# Patient Record
Sex: Male | Born: 1985 | Race: Black or African American | Hispanic: No | Marital: Single | Smoking: Current every day smoker
Health system: Southern US, Community
[De-identification: ages and names within clinical notes are randomized; demographics above are authoritative.]

## PROBLEM LIST (undated history)

## (undated) DIAGNOSIS — Z21 Asymptomatic human immunodeficiency virus [HIV] infection status: Secondary | ICD-10-CM

## (undated) DIAGNOSIS — A812 Progressive multifocal leukoencephalopathy: Secondary | ICD-10-CM

## (undated) DIAGNOSIS — B20 Human immunodeficiency virus [HIV] disease: Secondary | ICD-10-CM

---

## 2006-03-07 ENCOUNTER — Emergency Department (HOSPITAL_COMMUNITY): Admission: EM | Admit: 2006-03-07 | Discharge: 2006-03-07 | Payer: Self-pay | Admitting: Family Medicine

## 2007-07-15 IMAGING — CR DG SHOULDER 2+V*R*
4 series · 4 of 4 positions shown · non-contrast
Comparison: none

CLINICAL DATA: Trauma to shoulder with pain in the anterior humerus.  Evaluate for dislocation.
 RIGHT SHOULDER ? 3 VIEWS:

[view not recorded (1 of 4)]
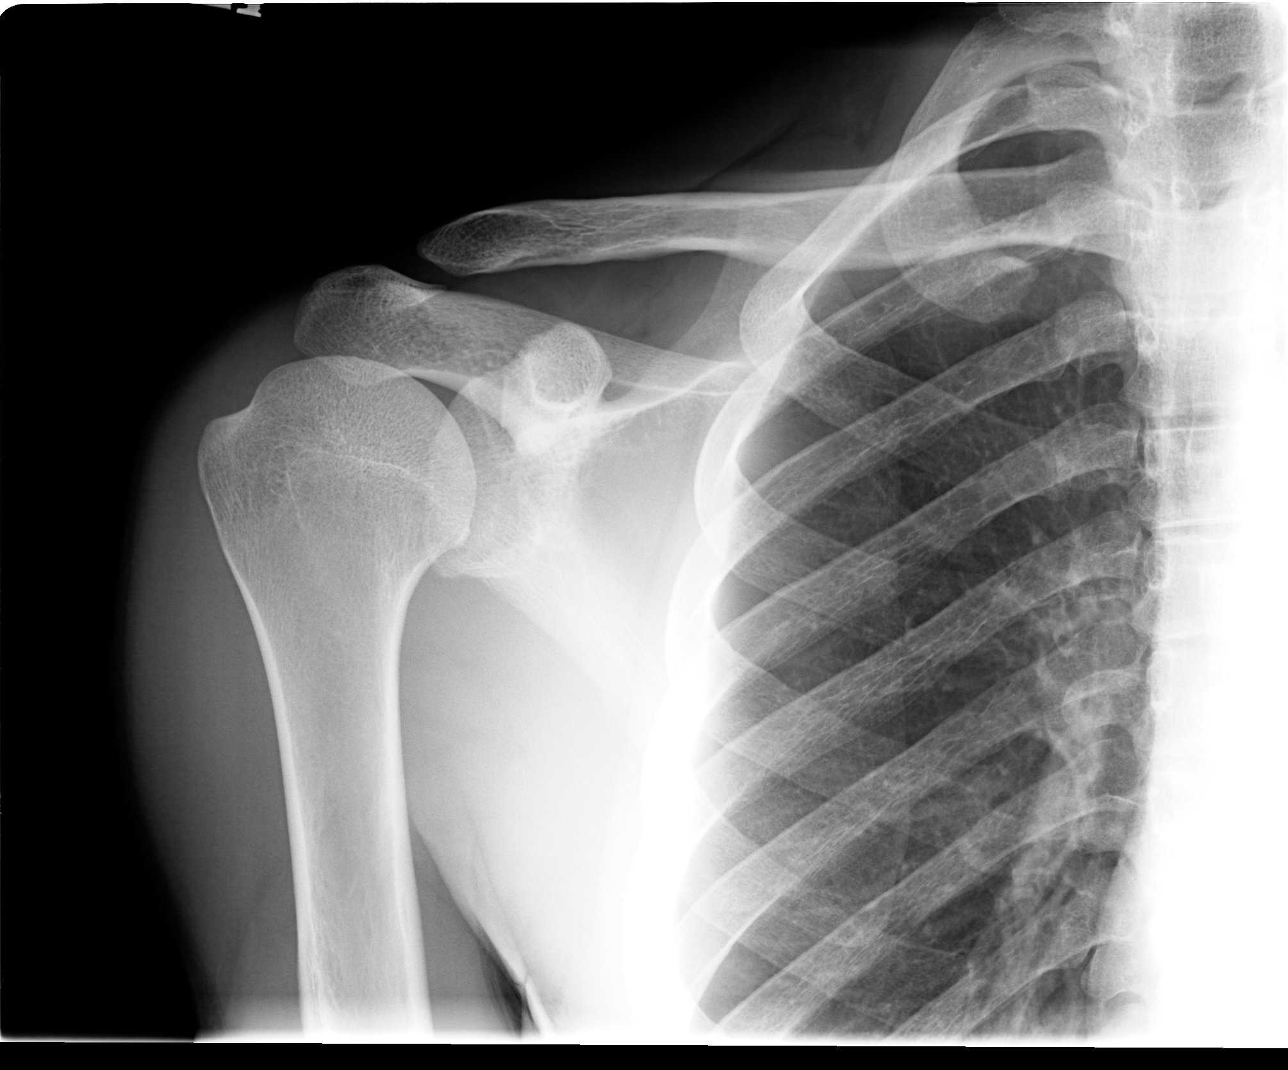

[view not recorded (2 of 4)]
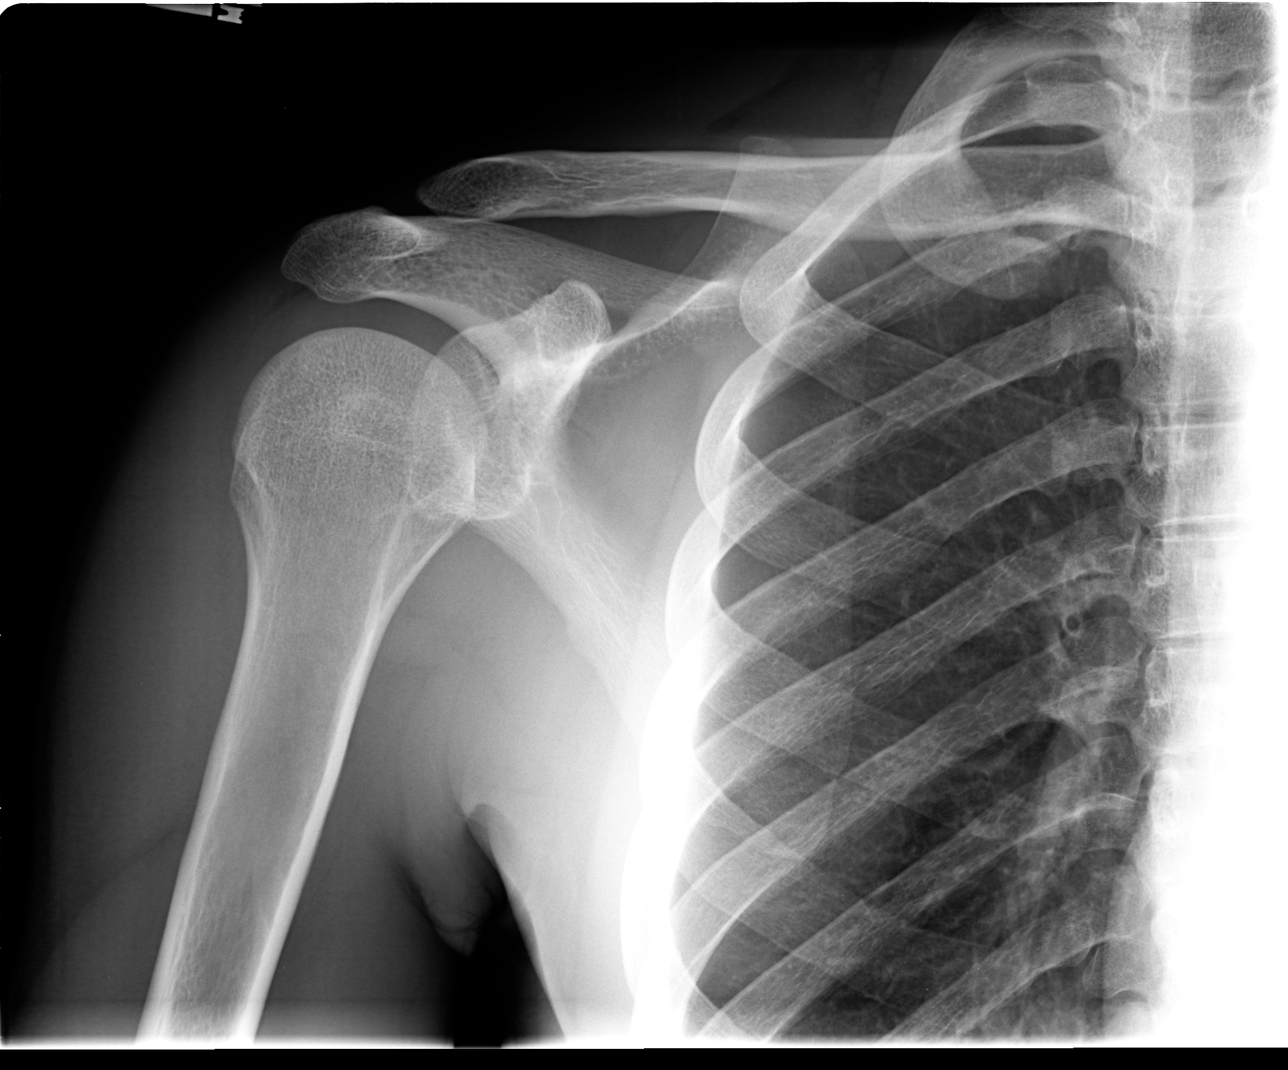

[view not recorded (3 of 4)]
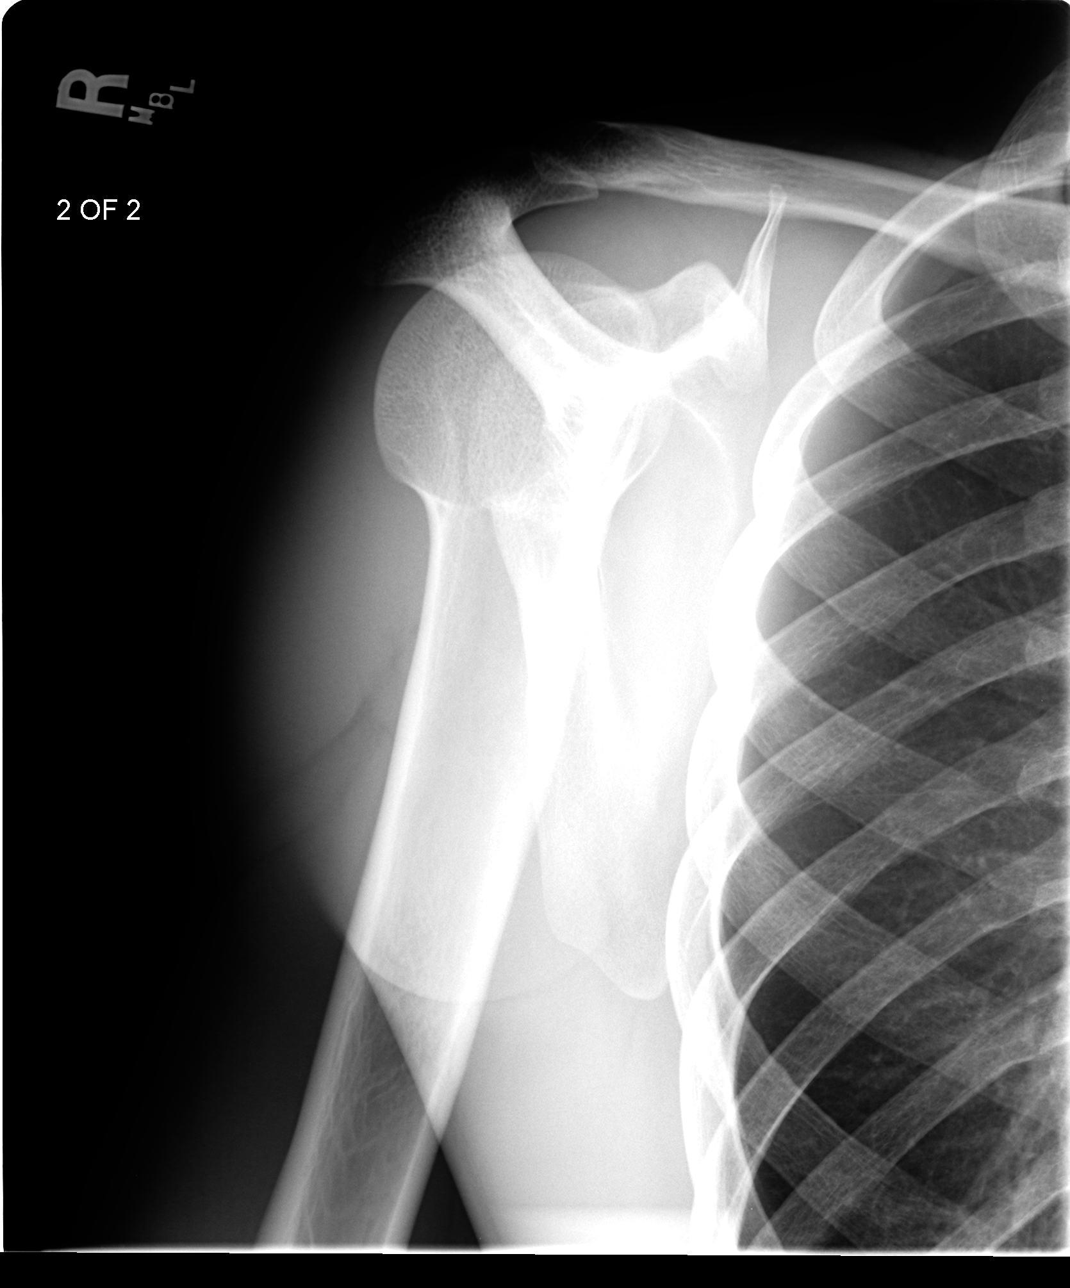

[view not recorded (4 of 4)]
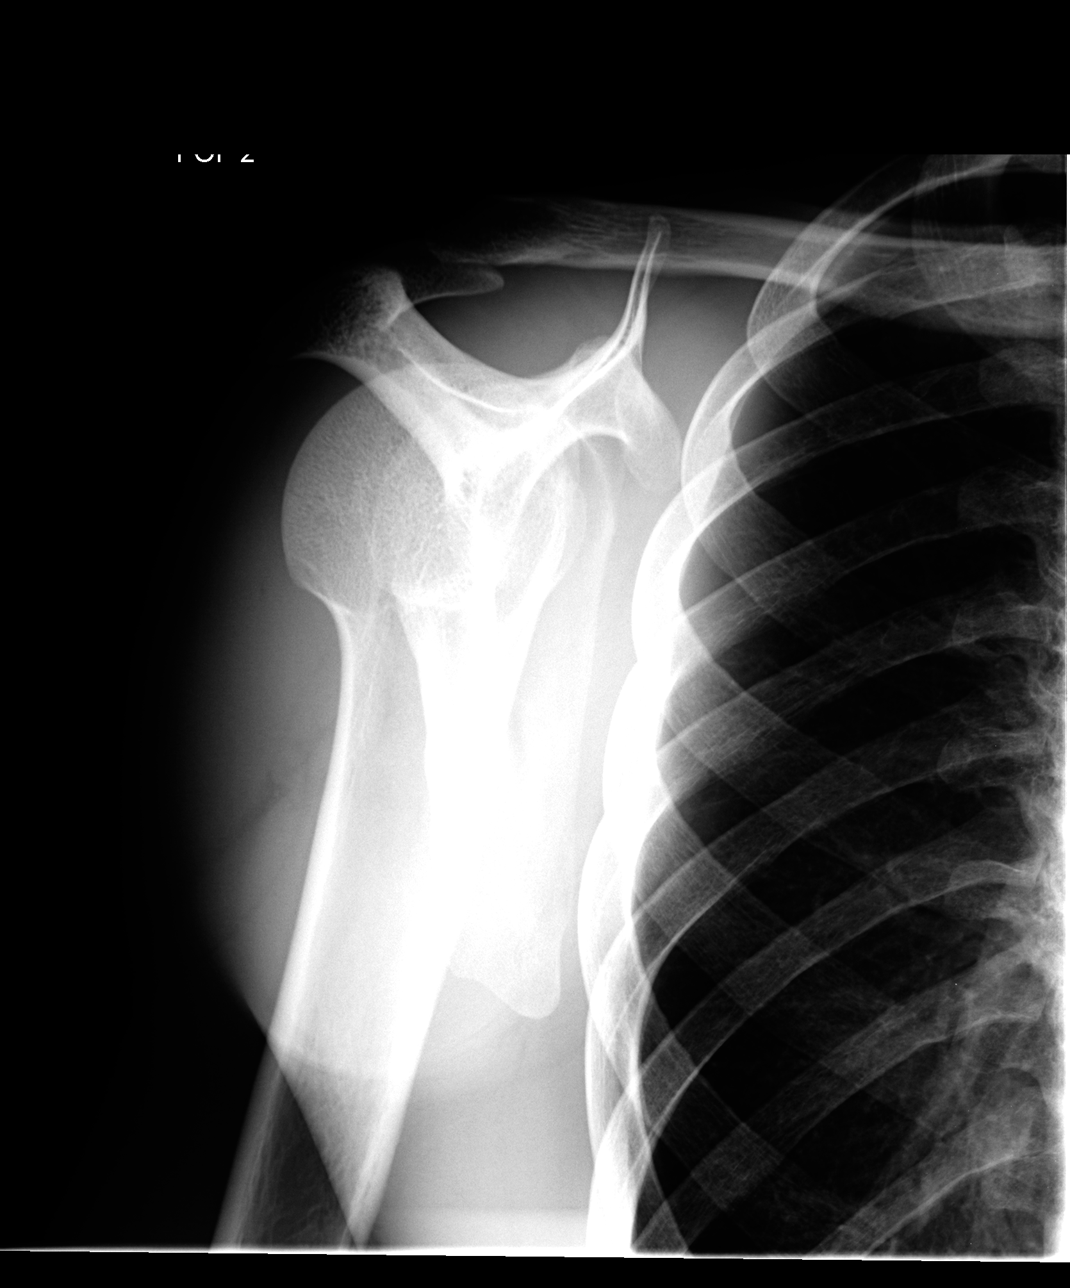

[4 of 4 positions shown; findings below may reference images not displayed]

FINDINGS: No dislocation is identified.  There are no fractures.
IMPRESSION: No acute findings.

## 2019-05-09 ENCOUNTER — Other Ambulatory Visit: Payer: Self-pay

## 2019-05-09 ENCOUNTER — Emergency Department (HOSPITAL_COMMUNITY)
Admission: EM | Admit: 2019-05-09 | Discharge: 2019-05-09 | Disposition: A | Discharge status: Home / Self Care | Payer: Medicare Other | Attending: Emergency Medicine | Admitting: Emergency Medicine

## 2019-05-09 ENCOUNTER — Encounter (HOSPITAL_COMMUNITY): Payer: Self-pay

## 2019-05-09 DIAGNOSIS — F1510 Other stimulant abuse, uncomplicated: Secondary | ICD-10-CM | POA: Diagnosis not present

## 2019-05-09 DIAGNOSIS — Z79899 Other long term (current) drug therapy: Secondary | ICD-10-CM | POA: Insufficient documentation

## 2019-05-09 DIAGNOSIS — R110 Nausea: Secondary | ICD-10-CM

## 2019-05-09 DIAGNOSIS — F17200 Nicotine dependence, unspecified, uncomplicated: Secondary | ICD-10-CM | POA: Insufficient documentation

## 2019-05-09 DIAGNOSIS — B20 Human immunodeficiency virus [HIV] disease: Secondary | ICD-10-CM | POA: Diagnosis not present

## 2019-05-09 HISTORY — DX: Asymptomatic human immunodeficiency virus (hiv) infection status: Z21

## 2019-05-09 HISTORY — DX: Human immunodeficiency virus (HIV) disease: B20

## 2019-05-09 HISTORY — DX: Progressive multifocal leukoencephalopathy: A81.2

## 2019-05-09 LAB — CBC WITH DIFFERENTIAL/PLATELET
Abs Immature Granulocytes: 0.02 10*3/uL (ref 0.00–0.07)
Basophils Absolute: 0 10*3/uL (ref 0.0–0.1)
Basophils Relative: 0 %
Eosinophils Absolute: 0 10*3/uL (ref 0.0–0.5)
Eosinophils Relative: 0 %
HCT: 45.6 % (ref 39.0–52.0)
Hemoglobin: 14.9 g/dL (ref 13.0–17.0)
Immature Granulocytes: 0 %
Lymphocytes Relative: 18 %
Lymphs Abs: 1.9 10*3/uL (ref 0.7–4.0)
MCH: 30 pg (ref 26.0–34.0)
MCHC: 32.7 g/dL (ref 30.0–36.0)
MCV: 91.8 fL (ref 80.0–100.0)
Monocytes Absolute: 0.7 10*3/uL (ref 0.1–1.0)
Monocytes Relative: 6 %
Neutro Abs: 8 10*3/uL — ABNORMAL HIGH (ref 1.7–7.7)
Neutrophils Relative %: 76 %
Platelets: 217 10*3/uL (ref 150–400)
RBC: 4.97 MIL/uL (ref 4.22–5.81)
RDW: 13.5 % (ref 11.5–15.5)
WBC: 10.6 10*3/uL — ABNORMAL HIGH (ref 4.0–10.5)
nRBC: 0 % (ref 0.0–0.2)

## 2019-05-09 LAB — COMPREHENSIVE METABOLIC PANEL
ALT: 24 U/L (ref 0–44)
AST: 30 U/L (ref 15–41)
Albumin: 5.4 g/dL — ABNORMAL HIGH (ref 3.5–5.0)
Alkaline Phosphatase: 55 U/L (ref 38–126)
Anion gap: 13 (ref 5–15)
BUN: 24 mg/dL — ABNORMAL HIGH (ref 6–20)
CO2: 24 mmol/L (ref 22–32)
Calcium: 9.4 mg/dL (ref 8.9–10.3)
Chloride: 103 mmol/L (ref 98–111)
Creatinine, Ser: 1.17 mg/dL (ref 0.61–1.24)
GFR calc Af Amer: >60 mL/min (ref >60)
GFR calc non Af Amer: >60 mL/min (ref >60)
Glucose, Bld: 163 mg/dL — ABNORMAL HIGH (ref 70–99)
Potassium: 3.6 mmol/L (ref 3.5–5.1)
Sodium: 140 mmol/L (ref 135–145)
Total Bilirubin: 0.8 mg/dL (ref 0.3–1.2)
Total Protein: 8.8 g/dL — ABNORMAL HIGH (ref 6.5–8.1)

## 2019-05-09 MED ORDER — ONDANSETRON 4 MG PO TBDP: ORAL_TABLET | ORAL | 0 refills | Status: AC

## 2019-05-09 MED ORDER — ONDANSETRON HCL 4 MG/2ML IJ SOLN
4.0000 mg | Freq: Once | INTRAMUSCULAR | Status: AC
  Administered 2019-05-09: 21:00:00 4 mg via INTRAVENOUS
  Filled 2019-05-09: qty 2

## 2019-05-09 MED ORDER — SODIUM CHLORIDE 0.9 % IV BOLUS
1000.0000 mL | Freq: Once | INTRAVENOUS | Status: AC
  Administered 2019-05-09: 21:00:00 1000 mL via INTRAVENOUS

## 2019-05-09 MED ORDER — LORAZEPAM 2 MG/ML IJ SOLN
0.5000 mg | Freq: Once | INTRAMUSCULAR | Status: AC
  Administered 2019-05-09: 0.5 mg via INTRAVENOUS
  Filled 2019-05-09: qty 1

## 2019-05-09 NOTE — Discharge Instructions (Signed)
Follow up if needed

## 2019-05-09 NOTE — ED Provider Notes (Signed)
[facility] EMERGENCY DEPARTMENT Provider Note   CSN: [id number] Arrival date & time: 05/09/19  2005     History   Chief Complaint Chief Complaint  Patient presents with  . Medication Reaction    HPI Daniel Donaldson is a 33 y.o. male.     Patient took some of his friends HIV medicines and now feels nauseated.  He only took 2 pills  The history is provided by the patient. No language interpreter was used.  Ingestion This is a new problem. The current episode started 12 to 24 hours ago. The problem occurs constantly. The problem has not changed since onset.Pertinent negatives include no chest pain, no abdominal pain and no headaches. Nothing aggravates the symptoms. Nothing relieves the symptoms. He has tried nothing for the symptoms. The treatment provided no relief.    Past Medical History:  Diagnosis Date  . HIV (human immunodeficiency virus infection) ([place])   . 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   Past Surgical History:  Procedure Laterality Date  . CONDYLOMA EXCISION/FULGURATION          Home Medications    Prior to Admission medications   Medication Sig Start Date End Date Taking? Authorizing Provider  DESCOVY 200-25 MG tablet Take 1 tablet by mouth daily. 04/22/19  Yes [provider]  desvenlafaxine (PRISTIQ) 50 MG 24 hr tablet Take 1 tablet by mouth daily. 04/11/19  Yes [provider]  PREZISTA 800 MG tablet Take 1 tablet by mouth daily. 04/22/19  Yes [provider]  ritonavir (NORVIR) 100 MG TABS tablet Take 1 tablet by mouth daily. 04/22/19  Yes [provider]  traZODone (DESYREL) 50 MG tablet Take 1 tablet by mouth daily. 04/11/19  Yes [provider]  ondansetron (ZOFRAN ODT) 4 MG disintegrating tablet 4mg  ODT q4 hours prn nausea/vomit 05/09/19   Milton Ferguson, MD    Family History No family history on file.  Social History  Social History   Tobacco Use  . Smoking status: Current Every Day Smoker  . Smokeless tobacco: Never Used  Substance Use Topics  . Alcohol use: Yes  . Drug use: Yes    Types: Methamphetamines     Allergies   Viread [tenofovir disoproxil fumarate]   Review of Systems Review of Systems  Constitutional: Negative for appetite change and fatigue.  HENT: Negative for congestion, ear discharge and sinus pressure.   Eyes: Negative for discharge.  Respiratory: Negative for cough.   Cardiovascular: Negative for chest pain.  Gastrointestinal: Positive for nausea. Negative for abdominal pain and diarrhea.  Genitourinary: Negative for frequency and hematuria.  Musculoskeletal: Negative for back pain.  Skin: Negative for rash.  Neurological: Negative for seizures and headaches.  Psychiatric/Behavioral: Negative for hallucinations.     Physical Exam Updated Vital Signs BP (!) 133/98   Pulse 98   Resp 18   Ht 5' 6.25" (1.683 m)   Wt 72.1 kg   SpO2 100%   BMI 25.47 kg/m   Physical Exam Constitutional:      Appearance: Normal appearance. He is well-developed.  HENT:     Head: Normocephalic.     Mouth/Throat:     Mouth: Mucous membranes are moist.  Eyes:     General: No scleral icterus.    Conjunctiva/sclera: Conjunctivae normal.  Neck:     Musculoskeletal: Neck supple.     Thyroid: No thyromegaly.  Cardiovascular:     Rate  and Rhythm: Normal rate and regular rhythm.     Heart sounds: No murmur. No friction rub. No gallop.   Pulmonary:     Breath sounds: No stridor. No wheezing or rales.  Chest:     Chest wall: No tenderness.  Abdominal:     General: There is no distension.     Tenderness: There is no abdominal tenderness. There is no rebound.  Musculoskeletal: Normal range of motion.  Lymphadenopathy:     Cervical: No cervical adenopathy.  Skin:    Findings: No erythema or rash.  Neurological:     Mental Status: He is alert and oriented to person, place, and  time.     Motor: No abnormal muscle tone.     Coordination: Coordination normal.  Psychiatric:        Behavior: Behavior normal.      ED Treatments / Results  Labs (all labs ordered are listed, but only abnormal results are displayed) Labs Reviewed  CBC WITH DIFFERENTIAL/PLATELET - Abnormal; Notable for the following components:      Result Value   WBC 10.6 (*)    Neutro Abs 8.0 (*)    All other components within normal limits  COMPREHENSIVE METABOLIC PANEL - Abnormal; Notable for the following components:   Glucose, Bld 163 (*)    BUN 24 (*)    Total Protein 8.8 (*)    Albumin 5.4 (*)    All other components within normal limits    EKG None  Radiology No results found.  Procedures Procedures (including critical care time)  Medications Ordered in ED Medications  sodium chloride 0.9 % bolus 1,000 mL (1,000 mLs Intravenous New Bag/Given 05/09/19 2120)  ondansetron (ZOFRAN) injection 4 mg (4 mg Intravenous Given 05/09/19 2126)  LORazepam (ATIVAN) injection 0.5 mg (0.5 mg Intravenous Given 05/09/19 2122)     Initial Impression / Assessment and Plan / ED Course  I have reviewed the triage vital signs and the nursing notes.  Pertinent labs & imaging results that were available during my care of the patient were reviewed by me and considered in my medical decision making (see chart for details).        Labs unremarkable.  Patient improved with nausea medicine he will be discharged home  Final Clinical Impressions(s) / ED Diagnoses   Final diagnoses:  Nausea    ED Discharge Orders         Ordered    ondansetron (ZOFRAN ODT) 4 MG disintegrating tablet     05/09/19 2231           Bethann BerkshireZammit, Alton Bouknight, MD 05/11/19 1136

## 2019-05-09 NOTE — ED Triage Notes (Signed)
Pt states he took some of his friends HIV antiviral meds, is vague about the reasons why, but states he has been feeling sick on his stomach since taking it.  Pt c/o increased thirst, decreased appetite, nausea, abd pain.  Pt also states he has used crystal meth this morning.

## 2019-06-05 ENCOUNTER — Other Ambulatory Visit: Payer: Self-pay

## 2019-06-05 ENCOUNTER — Inpatient Hospital Stay (HOSPITAL_COMMUNITY)
Admission: AD | Admit: 2019-06-05 | Discharge: 2019-06-11 | DRG: 885 | Disposition: A | Discharge status: Home / Self Care | Payer: Medicare Other | Source: Intra-hospital | Attending: Psychiatry | Admitting: Psychiatry

## 2019-06-05 ENCOUNTER — Encounter (HOSPITAL_COMMUNITY): Payer: Self-pay

## 2019-06-05 ENCOUNTER — Emergency Department (HOSPITAL_COMMUNITY)
Admission: EM | Admit: 2019-06-05 | Discharge: 2019-06-05 | Disposition: A | Discharge status: Other Acute Inpatient Hospital | Payer: Medicare Other | Source: Home / Self Care | Attending: Emergency Medicine | Admitting: Emergency Medicine

## 2019-06-05 ENCOUNTER — Other Ambulatory Visit: Payer: Self-pay | Admitting: Registered Nurse

## 2019-06-05 ENCOUNTER — Encounter (HOSPITAL_COMMUNITY): Payer: Self-pay | Admitting: Psychiatric/Mental Health

## 2019-06-05 DIAGNOSIS — Z590 Homelessness: Secondary | ICD-10-CM

## 2019-06-05 DIAGNOSIS — R440 Auditory hallucinations: Secondary | ICD-10-CM | POA: Insufficient documentation

## 2019-06-05 DIAGNOSIS — F419 Anxiety disorder, unspecified: Secondary | ICD-10-CM | POA: Diagnosis present

## 2019-06-05 DIAGNOSIS — F333 Major depressive disorder, recurrent, severe with psychotic symptoms: Secondary | ICD-10-CM | POA: Diagnosis present

## 2019-06-05 DIAGNOSIS — R509 Fever, unspecified: Secondary | ICD-10-CM | POA: Insufficient documentation

## 2019-06-05 DIAGNOSIS — R45851 Suicidal ideations: Secondary | ICD-10-CM

## 2019-06-05 DIAGNOSIS — Z20828 Contact with and (suspected) exposure to other viral communicable diseases: Secondary | ICD-10-CM | POA: Diagnosis present

## 2019-06-05 DIAGNOSIS — Z21 Asymptomatic human immunodeficiency virus [HIV] infection status: Secondary | ICD-10-CM | POA: Insufficient documentation

## 2019-06-05 DIAGNOSIS — R443 Hallucinations, unspecified: Secondary | ICD-10-CM | POA: Diagnosis not present

## 2019-06-05 DIAGNOSIS — F32A Depression, unspecified: Secondary | ICD-10-CM

## 2019-06-05 DIAGNOSIS — B20 Human immunodeficiency virus [HIV] disease: Secondary | ICD-10-CM | POA: Diagnosis present

## 2019-06-05 DIAGNOSIS — G4700 Insomnia, unspecified: Secondary | ICD-10-CM | POA: Diagnosis present

## 2019-06-05 DIAGNOSIS — Z79899 Other long term (current) drug therapy: Secondary | ICD-10-CM | POA: Insufficient documentation

## 2019-06-05 DIAGNOSIS — F17200 Nicotine dependence, unspecified, uncomplicated: Secondary | ICD-10-CM | POA: Insufficient documentation

## 2019-06-05 DIAGNOSIS — F329 Major depressive disorder, single episode, unspecified: Secondary | ICD-10-CM

## 2019-06-05 DIAGNOSIS — Z046 Encounter for general psychiatric examination, requested by authority: Secondary | ICD-10-CM | POA: Insufficient documentation

## 2019-06-05 LAB — COMPREHENSIVE METABOLIC PANEL
ALT: 19 U/L (ref 0–44)
AST: 19 U/L (ref 15–41)
Albumin: 3.8 g/dL (ref 3.5–5.0)
Alkaline Phosphatase: 40 U/L (ref 38–126)
Anion gap: 7 (ref 5–15)
BUN: 18 mg/dL (ref 6–20)
CO2: 24 mmol/L (ref 22–32)
Calcium: 8.8 mg/dL — ABNORMAL LOW (ref 8.9–10.3)
Chloride: 109 mmol/L (ref 98–111)
Creatinine, Ser: 0.79 mg/dL (ref 0.61–1.24)
GFR calc Af Amer: >60 mL/min (ref >60)
GFR calc non Af Amer: >60 mL/min (ref >60)
Glucose, Bld: 109 mg/dL — ABNORMAL HIGH (ref 70–99)
Potassium: 3.9 mmol/L (ref 3.5–5.1)
Sodium: 140 mmol/L (ref 135–145)
Total Bilirubin: 0.3 mg/dL (ref 0.3–1.2)
Total Protein: 6.7 g/dL (ref 6.5–8.1)

## 2019-06-05 LAB — CBC
HCT: 40.4 % (ref 39.0–52.0)
Hemoglobin: 13.2 g/dL (ref 13.0–17.0)
MCH: 30.9 pg (ref 26.0–34.0)
MCHC: 32.7 g/dL (ref 30.0–36.0)
MCV: 94.6 fL (ref 80.0–100.0)
Platelets: 198 10*3/uL (ref 150–400)
RBC: 4.27 MIL/uL (ref 4.22–5.81)
RDW: 13.5 % (ref 11.5–15.5)
WBC: 3.8 10*3/uL — ABNORMAL LOW (ref 4.0–10.5)
nRBC: 0 % (ref 0.0–0.2)

## 2019-06-05 LAB — ETHANOL: Alcohol, Ethyl (B): <10 mg/dL (ref <10)

## 2019-06-05 LAB — RAPID URINE DRUG SCREEN, HOSP PERFORMED
Amphetamines: POSITIVE — AB
Barbiturates: NOT DETECTED
Benzodiazepines: NOT DETECTED
Cocaine: NOT DETECTED
Opiates: NOT DETECTED
Tetrahydrocannabinol: POSITIVE — AB

## 2019-06-05 LAB — SARS CORONAVIRUS 2 BY RT PCR (HOSPITAL ORDER, PERFORMED IN ~~LOC~~ HOSPITAL LAB): SARS Coronavirus 2: NEGATIVE

## 2019-06-05 MED ORDER — TRAZODONE HCL 50 MG PO TABS
50.0000 mg | ORAL_TABLET | Freq: Every evening | ORAL | Status: DC | PRN
  Administered 2019-06-05 – 2019-06-10 (×6): 50 mg via ORAL
  Filled 2019-06-05 (×6): qty 1

## 2019-06-05 MED ORDER — LORAZEPAM 1 MG PO TABS
1.0000 mg | ORAL_TABLET | Freq: Once | ORAL | Status: AC
  Administered 2019-06-05: 1 mg via ORAL
  Filled 2019-06-05: qty 1

## 2019-06-05 MED ORDER — HYDROXYZINE HCL 50 MG PO TABS
50.0000 mg | ORAL_TABLET | Freq: Three times a day (TID) | ORAL | Status: DC | PRN
  Administered 2019-06-05 – 2019-06-11 (×7): 50 mg via ORAL
  Filled 2019-06-05 (×7): qty 1

## 2019-06-05 MED ORDER — ACETAMINOPHEN 325 MG PO TABS
650.0000 mg | ORAL_TABLET | Freq: Four times a day (QID) | ORAL | Status: DC | PRN
  Administered 2019-06-06 – 2019-06-10 (×3): 650 mg via ORAL
  Filled 2019-06-05 (×3): qty 2

## 2019-06-05 MED ORDER — NICOTINE POLACRILEX 2 MG MT GUM
2.0000 mg | CHEWING_GUM | OROMUCOSAL | Status: DC | PRN
  Administered 2019-06-09 – 2019-06-11 (×3): 2 mg via ORAL
  Filled 2019-06-05: qty 1

## 2019-06-05 NOTE — ED Provider Notes (Addendum)
[facility]-EMERGENCY DEPT Provider Note   CSN: [id number] Arrival date & time: 06/05/19  1253    History   Chief Complaint Chief Complaint  Patient presents with  . Suicidal    HPI Daniel Donaldson is a 33 y.o. male.     Patient with hx depression, hiv, c/o increased feelings of depression in the past 1-2 weeks. Symptoms acute onset, moderate, persistent, worse in past few days. Denies specific inciting event or stressor, other than stating he quit taking his medication a couple weeks ago. States has meds at home, but just quit taking - states when taking he did not some mild improvement in symptoms. States hearing voices, and is having thoughts of suicide. Denies specific plan, and denies any attempt to harm self. Hx meth abuse. Denies etoh abuse. [facility], so sent to ED. Pt denies known covid + exposure. Denies cough or uri symptoms. No abd pain or nvd. No gu c/o. No rash.   The history is provided by the patient.    Past Medical History:  Diagnosis Date  . HIV (human immunodeficiency virus infection) ([facility])   . PML (progressive multifocal leukoencephalopathy) ([facility])     There are no active problems to display for this patient.   Past Surgical History:  Procedure Laterality Date  . CONDYLOMA EXCISION/FULGURATION          Home Medications    Prior to Admission medications   Medication Sig Start Date End Date Taking? Authorizing Provider  DESCOVY 200-25 MG tablet Take 1 tablet by mouth daily. 04/22/19   [provider]  desvenlafaxine (PRISTIQ) 50 MG 24 hr tablet Take 1 tablet by mouth daily. 04/11/19   [provider]  ondansetron (ZOFRAN ODT) 4 MG disintegrating tablet 4mg  ODT q4 hours prn nausea/vomit 05/09/19   Bethann BerkshireZammit, Joseph, MD  PREZISTA 800 MG tablet Take 1 tablet by mouth daily. 04/22/19   [provider]  ritonavir (NORVIR) 100 MG TABS tablet Take 1 tablet by mouth daily. 04/22/19   [provider]  traZODone (DESYREL) 50 MG tablet Take 1 tablet by mouth daily. 04/11/19   [provider]    Family History No family history on file.  Social History Social History   Tobacco Use  . Smoking status: Current Every Day Smoker  . Smokeless tobacco: Never Used  Substance Use Topics  . Alcohol use: Yes  . Drug use: Yes    Types: Methamphetamines     Allergies   Viread [tenofovir disoproxil fumarate]   Review of Systems Review of Systems  Constitutional: Positive for fever. Negative for chills.  HENT: Negative for sore throat.   Eyes: Negative for pain and redness.  Respiratory: Negative for cough and shortness of breath.   Cardiovascular: Negative for chest pain.  Gastrointestinal: Negative for abdominal pain, diarrhea and vomiting.  Endocrine: Negative for polyuria.  Genitourinary: Negative for dysuria and flank pain.  Musculoskeletal: Negative for back pain, neck pain and neck stiffness.  Skin: Negative for rash.  Neurological: Negative for headaches.  Hematological: Does not bruise/bleed easily.  Psychiatric/Behavioral: Positive for dysphoric mood. Negative for confusion.     Physical Exam Updated Vital Signs BP 119/68   Pulse 71   Temp 98.3 F (36.8 C) (Oral)   Resp 15   SpO2 96%   Physical Exam Vitals signs and nursing note reviewed.  Constitutional:      Appearance: Normal appearance. He is well-developed.  HENT:  Head: Atraumatic.     Nose: Nose normal.     Mouth/Throat:     Mouth: Mucous membranes are moist.     Pharynx: Oropharynx is clear.  Eyes:     General: No scleral icterus.    Conjunctiva/sclera: Conjunctivae normal.     Pupils: Pupils are equal, round, and reactive to light.  Neck:     Musculoskeletal: Normal range of motion and neck supple. No neck rigidity.     Trachea: No tracheal deviation.     Comments: No stiffness or rigidity Cardiovascular:     Rate and Rhythm: Normal rate and regular rhythm.      Pulses: Normal pulses.     Heart sounds: Normal heart sounds. No murmur. No friction rub. No gallop.   Pulmonary:     Effort: Pulmonary effort is normal. No accessory muscle usage or respiratory distress.     Breath sounds: Normal breath sounds.  Abdominal:     General: Bowel sounds are normal. There is no distension.     Palpations: Abdomen is soft.     Tenderness: There is no abdominal tenderness. There is no guarding.  Genitourinary:    Comments: No cva tenderness. Musculoskeletal:        General: No swelling.  Lymphadenopathy:     Cervical: No cervical adenopathy.  Skin:    General: Skin is warm and dry.     Findings: No rash.  Neurological:     Mental Status: He is alert.     Comments: Alert, speech clear/fluent. Steady gait.   Psychiatric:     Comments: Depressed mood. +SI.       ED Treatments / Results  Labs (all labs ordered are listed, but only abnormal results are displayed) Results for orders placed or performed during the hospital encounter of 06/05/19  SARS Coronavirus 2 [facility] order, Performed in [facility] hospital lab) Nasopharyngeal Nasopharyngeal Swab   Specimen: Nasopharyngeal Swab  Result Value Ref Range   SARS Coronavirus 2 NEGATIVE NEGATIVE  CBC  Result Value Ref Range   WBC 3.8 (L) 4.0 - 10.5 K/uL   RBC 4.27 4.22 - 5.81 MIL/uL   Hemoglobin 13.2 13.0 - 17.0 g/dL   HCT 40.4 39.0 - 52.0 %   MCV 94.6 80.0 - 100.0 fL   MCH 30.9 26.0 - 34.0 pg   MCHC 32.7 30.0 - 36.0 g/dL   RDW 13.5 11.5 - 15.5 %   Platelets 198 150 - 400 K/uL   nRBC 0.0 0.0 - 0.2 %  Comprehensive metabolic panel  Result Value Ref Range   Sodium 140 135 - 145 mmol/L   Potassium 3.9 3.5 - 5.1 mmol/L   Chloride 109 98 - 111 mmol/L   CO2 24 22 - 32 mmol/L   Glucose, Bld 109 (H) 70 - 99 mg/dL   BUN 18 6 - 20 mg/dL   Creatinine, Ser 0.79 0.61 - 1.24 mg/dL   Calcium 8.8 (L) 8.9 - 10.3 mg/dL   Total Protein 6.7 6.5 - 8.1 g/dL   Albumin 3.8 3.5 - 5.0 g/dL   AST 19 15 - 41 U/L    ALT 19 0 - 44 U/L   Alkaline Phosphatase 40 38 - 126 U/L   Total Bilirubin 0.3 0.3 - 1.2 mg/dL   GFR calc non Af Amer >60 >60 mL/min   GFR calc Af Amer >60 >60 mL/min   Anion gap 7 5 - 15  Ethanol  Result Value Ref Range   Alcohol, Ethyl (B) <10 <  10 mg/dL  Rapid urine drug screen (hospital performed)  Result Value Ref Range   Opiates NONE DETECTED NONE DETECTED   Cocaine NONE DETECTED NONE DETECTED   Benzodiazepines NONE DETECTED NONE DETECTED   Amphetamines POSITIVE (A) NONE DETECTED   Tetrahydrocannabinol POSITIVE (A) NONE DETECTED   Barbiturates NONE DETECTED NONE DETECTED    EKG None  Radiology No results found.  Procedures Procedures (including critical care time)  Medications Ordered in ED Medications - No data to display   Initial Impression / Assessment and Plan / ED Course  I have reviewed the triage vital signs and the nursing notes.  Pertinent labs & imaging results that were available during my care of the patient were reviewed by me and considered in my medical decision making (see chart for details).  Labs sent.   Reviewed nursing notes and prior charts for additional history.   Daniel Donaldson was evaluated in Emergency Department on 06/05/2019 for the symptoms described in the history of present illness. He was evaluated in the context of the global COVID-19 pandemic, which necessitated consideration that the patient might be at risk for infection with the SARS-CoV-2 virus that causes COVID-19. Institutional protocols and algorithms that pertain to the evaluation of patients at risk for COVID-19 are in a state of rapid change based on information released by regulatory bodies including the CDC and federal and state organizations. These policies and algorithms were followed during the patient's care in the ED.  Behavioral health team asked to evaluate - consult placed.   Labs reviewed by me - chem normal.   Patient alert, content, no distress, afebrile in  ED.  BH eval pending.   covid test is negative.   [facility]      Final Clinical Impressions(s) / ED Diagnoses   Final diagnoses:  Acute depression  Suicidal ideation    ED Discharge Orders    None       Cathren LaineSteinl, Lonnell Chaput, MD 06/05/19 1408    Cathren LaineSteinl, Zebulan Hinshaw, MD 06/05/19 1549

## 2019-06-05 NOTE — Tx Team (Signed)
Initial Treatment Plan 06/05/2019 11:25 PM Daniel Donaldson [id number]    PATIENT STRESSORS: Financial difficulties Medication change or noncompliance Substance abuse   PATIENT STRENGTHS: Ability for insight Active sense of humor General fund of knowledge Motivation for treatment/growth Supportive family/friends   PATIENT IDENTIFIED PROBLEMS: "At risk for suicide"   "Depression"   "Homeless"   "Substance use"                DISCHARGE CRITERIA:  Ability to meet basic life and health needs Adequate post-discharge living arrangements Improved stabilization in mood, thinking, and/or behavior Verbal commitment to aftercare and medication compliance Withdrawal symptoms are absent or subacute and managed without 24-hour nursing intervention  PRELIMINARY DISCHARGE PLAN: Attend PHP/IOP Attend 12-step recovery group Outpatient therapy Placement in alternative living arrangements  PATIENT/FAMILY INVOLVEMENT: This treatment plan has been presented to and reviewed with the patient, Daniel Donaldson. The patient have been given the opportunity to ask questions and make suggestions.  Lonia Skinner, RN 06/05/2019, 11:25 PM

## 2019-06-05 NOTE — Discharge Instructions (Signed)
Transfer to [employer].
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
## 2019-06-05 NOTE — Progress Notes (Signed)
Pt accepted to [facility]; room 304-2  Dr. Mariea Clonts is the attending provider.    Dr. Parke Poisson is the accepting provider.   Call report to [phone]    Tim @ [facility] ED notified.     Pt is involuntary and will be transported by law enforcement   Pt may arrive after medical clearance has been documented and after First Exam paperwork of IVC paperwork has been received.   Audree Camel, LCSW, [place] Disposition [facility]/TTS [phone]

## 2019-06-05 NOTE — Progress Notes (Signed)
[place] NOVEL CORONAVIRUS (COVID-19) DAILY CHECK-OFF SYMPTOMS - answer yes or no to each - every day NO YES  Have you had a fever in the past 24 hours?  . Fever (Temp > 37.80C / 100F) X   Have you had any of these symptoms in the past 24 hours? . New Cough .  Sore Throat  .  Shortness of Breath .  Difficulty Breathing .  Unexplained Body Aches   X   Have you had any one of these symptoms in the past 24 hours not related to allergies?   . Runny Nose .  Nasal Congestion .  Sneezing   X   If you have had runny nose, nasal congestion, sneezing in the past 24 hours, has it worsened?  X   EXPOSURES - check yes or no X   Have you traveled outside the state in the past 14 days?  X   Have you been in contact with someone with a confirmed diagnosis of COVID-19 or PUI in the past 14 days without wearing appropriate PPE?  X   Have you been living in the same home as a person with confirmed diagnosis of COVID-19 or a PUI (household contact)?    X   Have you been diagnosed with COVID-19?    X              What to do next: Answered NO to all: Answered YES to anything:   Proceed with unit schedule Follow the [facility] Inpatient Flowsheet.   

## 2019-06-05 NOTE — Progress Notes (Signed)
Daniel Donaldson is a 33 y.o male admitted IVC to [facility] for suicidal ideation with plan to stop talking HIV medications, increasing depression, and substance use. Pt endorses auditory hallucinations stating he hears "chatter" and command voices telling him to hurt himself.Pt denies visual hallucinations at this time. Pt states he has been using crystal meth once a month since february 2019. Pt state she smokes marijuana daily and alcohol occasionally 1x month. Pt endorses main stressor being homelessness. Pt states he goes to [facility] for medication management. Pt states he hopes to get resources for housing. Pt states he is open for long term treatment. Skin was assessed and found to be clear of any abnormal marks. Pt searched and no contraband found, POC and unit policies explained and understanding verbalized. Consents obtained. Food and fluids offered, and both accepted. Pt had no additional questions or concerns. Belongings in locker #15. Support offered and safety maintained.

## 2019-06-05 NOTE — ED Notes (Signed)
Have made two attempts to call report to [facility] to call report without answer. Will continue to attempt to call.

## 2019-06-06 DIAGNOSIS — F333 Major depressive disorder, recurrent, severe with psychotic symptoms: Principal | ICD-10-CM

## 2019-06-06 MED ORDER — GABAPENTIN 300 MG PO CAPS
300.0000 mg | ORAL_CAPSULE | Freq: Three times a day (TID) | ORAL | Status: DC
  Administered 2019-06-06: 12:00:00 300 mg via ORAL
  Filled 2019-06-06 (×6): qty 1

## 2019-06-06 MED ORDER — DARUNAVIR ETHANOLATE 800 MG PO TABS
800.0000 mg | ORAL_TABLET | Freq: Every day | ORAL | Status: DC
  Administered 2019-06-07 – 2019-06-11 (×5): 800 mg via ORAL
  Filled 2019-06-06: qty 1
  Filled 2019-06-06: qty 2
  Filled 2019-06-06 (×3): qty 1
  Filled 2019-06-06: qty 2
  Filled 2019-06-06 (×2): qty 1

## 2019-06-06 MED ORDER — FLUOXETINE HCL 20 MG PO CAPS
20.0000 mg | ORAL_CAPSULE | Freq: Every day | ORAL | Status: DC
  Administered 2019-06-06: 11:00:00 20 mg via ORAL
  Filled 2019-06-06 (×3): qty 1

## 2019-06-06 MED ORDER — VENLAFAXINE HCL ER 75 MG PO CP24
75.0000 mg | ORAL_CAPSULE | Freq: Every day | ORAL | Status: DC
  Filled 2019-06-06 (×2): qty 1

## 2019-06-06 MED ORDER — RISPERIDONE 1 MG PO TABS
1.0000 mg | ORAL_TABLET | Freq: Two times a day (BID) | ORAL | Status: DC
  Administered 2019-06-06 – 2019-06-08 (×4): 1 mg via ORAL
  Filled 2019-06-06 (×6): qty 1

## 2019-06-06 MED ORDER — RITONAVIR 100 MG PO TABS
100.0000 mg | ORAL_TABLET | Freq: Every day | ORAL | Status: DC
  Administered 2019-06-07 – 2019-06-11 (×5): 100 mg via ORAL
  Filled 2019-06-06: qty 2
  Filled 2019-06-06 (×6): qty 1
  Filled 2019-06-06: qty 2

## 2019-06-06 MED ORDER — MIRTAZAPINE 15 MG PO TABS
15.0000 mg | ORAL_TABLET | Freq: Every day | ORAL | Status: DC
  Administered 2019-06-06 – 2019-06-10 (×5): 15 mg via ORAL
  Filled 2019-06-06: qty 7
  Filled 2019-06-06 (×2): qty 1
  Filled 2019-06-06: qty 7
  Filled 2019-06-06 (×5): qty 1

## 2019-06-06 MED ORDER — TEMAZEPAM 15 MG PO CAPS: 30.0000 mg | ORAL_CAPSULE | Freq: Every day | ORAL | Status: DC
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
## 2019-06-06 NOTE — Progress Notes (Signed)
Patient states he has been inpatient at [facility], and a hospital in [place] within the past month for the same issues he is presenting with now: AH, SI, depression, and homelessness. He is focused on obtaining housing, he states that if he is not placed into stable housing, he will be right back.  CSW discussed [employer] and shelter resources with patient. Patient voiced understanding. He inquired about being transferred to [facility] [place] explained that [facility] is also a behavioral health hospital and offers the same level of care.  CSW will fax out an [facility] referral as well, patient has been made aware of the waitlist (greater than 1 month).  Stephanie Acre, LCSW-A Clinical Social Worker

## 2019-06-06 NOTE — Progress Notes (Signed)
D:Pt has a flat/depressed affect. He reports passive si thoughts and voices that tell him he is worthless. Pt has poor eye contact and is fidgety.  A:Offered support, encouragement and 15 minute checks. Educated pt on benefits/ side effects of prozac. R:Pt contracts with staff for safety. Safety maintained on the unit.

## 2019-06-06 NOTE — Progress Notes (Signed)
[place] NOVEL CORONAVIRUS (COVID-19) DAILY CHECK-OFF SYMPTOMS - answer yes or no to each - every day NO YES  Have you had a fever in the past 24 hours?  . Fever (Temp > 37.80C / 100F) X   Have you had any of these symptoms in the past 24 hours? . New Cough .  Sore Throat  .  Shortness of Breath .  Difficulty Breathing .  Unexplained Body Aches   X   Have you had any one of these symptoms in the past 24 hours not related to allergies?   . Runny Nose .  Nasal Congestion .  Sneezing   X   If you have had runny nose, nasal congestion, sneezing in the past 24 hours, has it worsened?  X   EXPOSURES - check yes or no X   Have you traveled outside the state in the past 14 days?  X   Have you been in contact with someone with a confirmed diagnosis of COVID-19 or PUI in the past 14 days without wearing appropriate PPE?  X   Have you been living in the same home as a person with confirmed diagnosis of COVID-19 or a PUI (household contact)?    X   Have you been diagnosed with COVID-19?    X              What to do next: Answered NO to all: Answered YES to anything:   Proceed with unit schedule Follow the [facility] Inpatient Flowsheet.   

## 2019-06-06 NOTE — H&P (Addendum)
Psychiatric Admission Assessment Adult  Patient Identification: Daniel Donaldson MRN:  [id number] Date of Evaluation:  06/06/2019 Chief Complaint:  "I hear voices and feel suicidal." Principal Diagnosis: <principal problem not specified> Diagnosis:  Active Problems:   MDD (major depressive disorder), recurrent, severe, with psychosis ([place])   History of Present Illness: Daniel Donaldson is a 33 year old homeless male with history of depression, methamphetamine use disorder, and HIV, presenting for treatment of suicidal ideation with auditory hallucinations. Patient reports he had gone to [facility] for an appointment and they sent him to the hospital due to [facility] and [place]. He is significantly irritable during assessment and a poor historian, providing vague information and refusing to answer questions. He admits to multiple hospitalizations this year and states "I'm not sure" when asked about the number of hospitalizations. Most recent hospitalization was in [place], [place] in July 2020. He is unable to say what medications he was discharged on. He reports stopping all of his medications two weeks ago including HIV medications, as a suicide attempt. He reports depression, SI, and CAH to kill himself for the last two weeks. He states medications from [facility] had helped him with mood and auditory hallucinations until he stopped them. He admits to using meth once a month and says "I'm not sure" when asked about last use. UDS positive for amphetamines and THC. He denies VH and HI. He reports continuing SI with plan to stop his medications. He states "I'm not sure" when asked about triggers for depression/SI. Prior notes indicate he had identified homelessness as main stressor and requested assistance with housing. He denies suicidal plan or intent on the unit.   I called [place], who confirmed patient's psychotropic medications- Remeron 15 mg QHS, Risperdal 1 mg BID, and Effexor XR 75 mg daily.  Associated  Signs/Symptoms: Depression Symptoms:  depressed mood, anhedonia, insomnia, fatigue, hopelessness, suicidal thoughts with specific plan, (Hypo) Manic Symptoms:  Irritable Mood, Anxiety Symptoms:  Excessive Worry, Psychotic Symptoms:  Hallucinations: Auditory Command:  to kill self PTSD Symptoms: Negative Total Time spent with patient: 30 minutes  Past Psychiatric History: History of depression, auditory hallucinations, methamphetamine use with multiple hospitalizations this year. He reports homelessness since February 2020. He reports suicide attempt via walking into traffic in May 2020. Denies history of mania.  Is the patient at risk to self? Yes.    Has the patient been a risk to self in the past 6 months? Yes.    Has the patient been a risk to self within the distant past? Yes.    Is the patient a risk to others? No.  Has the patient been a risk to others in the past 6 months? No.  Has the patient been a risk to others within the distant past? No.   Prior Inpatient Therapy:   Prior Outpatient Therapy:    Alcohol Screening: 1. How often do you have a drink containing alcohol?: Monthly or less 2. How many drinks containing alcohol do you have on a typical day when you are drinking?: 1 or 2 3. How often do you have six or more drinks on one occasion?: Less than monthly AUDIT-C Score: 2 4. How often during the last year have you found that you were not able to stop drinking once you had started?: Never 5. How often during the last year have you failed to do what was normally expected from you becasue of drinking?: Never 6. How often during the last year have you needed a first drink in  the morning to get yourself going after a heavy drinking session?: Never 7. How often during the last year have you had a feeling of guilt of remorse after drinking?: Never 8. How often during the last year have you been unable to remember what happened the night before because you had been drinking?:  Never 9. Have you or someone else been injured as a result of your drinking?: No 10. Has a relative or friend or a doctor or another health worker been concerned about your drinking or suggested you cut down?: No Alcohol Use Disorder Identification Test Final Score (AUDIT): 2 Substance Abuse History in the last 12 months:  Yes.   Consequences of Substance Abuse: denies Previous Psychotropic Medications: Yes  Psychological Evaluations: No  Past Medical History:  Past Medical History:  Diagnosis Date  . HIV (human immunodeficiency virus infection) (HCC)   . PML (progressive multifocal leukoencephalopathy) (HCC)     Past Surgical History:  Procedure Laterality Date  . CONDYLOMA EXCISION/FULGURATION     Family History: History reviewed. No pertinent family history. Family Psychiatric  History: Nephew with schizophrenia. Tobacco Screening: Have you used any form of tobacco in the last 30 days? (Cigarettes, Smokeless Tobacco, Cigars, and/or Pipes): Yes Tobacco use, Select all that apply: 4 or less cigarettes per day Are you interested in Tobacco Cessation Medications?: Yes, will notify MD for an order Counseled patient on smoking cessation including recognizing danger situations, developing coping skills and basic information about quitting provided: Yes Social History:  Social History   Substance and Sexual Activity  Alcohol Use Yes     Social History   Substance and Sexual Activity  Drug Use Yes  . Types: Methamphetamines    Additional Social History:                           Allergies:   Allergies  Allergen Reactions  . Viread [Tenofovir Disoproxil Fumarate] Hives   Lab Results:  Results for orders placed or performed during the hospital encounter of 06/05/19 (from the past 48 hour(s))  CBC     Status: Abnormal   Collection Time: 06/05/19  1:23 PM  Result Value Ref Range   WBC 3.8 (L) 4.0 - 10.5 K/uL   RBC 4.27 4.22 - 5.81 MIL/uL   Hemoglobin 13.2 13.0 -  17.0 g/dL   HCT 16.140.4 09.639.0 - 04.552.0 %   MCV 94.6 80.0 - 100.0 fL   MCH 30.9 26.0 - 34.0 pg   MCHC 32.7 30.0 - 36.0 g/dL   RDW 40.913.5 81.111.5 - 91.415.5 %   Platelets 198 150 - 400 K/uL   nRBC 0.0 0.0 - 0.2 %    Comment: Performed at [facility], [address] [place], [place] [address]  Comprehensive metabolic panel     Status: Abnormal   Collection Time: 06/05/19  1:23 PM  Result Value Ref Range   Sodium 140 135 - 145 mmol/L   Potassium 3.9 3.5 - 5.1 mmol/L   Chloride 109 98 - 111 mmol/L   CO2 24 22 - 32 mmol/L   Glucose, Bld 109 (H) 70 - 99 mg/dL   BUN 18 6 - 20 mg/dL   Creatinine, Ser 6.210.79 0.61 - 1.24 mg/dL   Calcium 8.8 (L) 8.9 - 10.3 mg/dL   Total Protein 6.7 6.5 - 8.1 g/dL   Albumin 3.8 3.5 - 5.0 g/dL   AST 19 15 - 41 U/L   ALT 19 0 -  44 U/L   Alkaline Phosphatase 40 38 - 126 U/L   Total Bilirubin 0.3 0.3 - 1.2 mg/dL   GFR calc non Af Amer >60 >60 mL/min   GFR calc Af Amer >60 >60 mL/min   Anion gap 7 5 - 15    Comment: Performed at [facility], [address] [place], [place] [address]  Ethanol     Status: None   Collection Time: 06/05/19  1:23 PM  Result Value Ref Range   Alcohol, Ethyl (B) <10 <10 mg/dL    Comment: (NOTE) Lowest detectable limit for serum alcohol is 10 mg/dL. For medical purposes only. Performed at [facility], [address] [place], [place] [address]   Rapid urine drug screen (hospital performed)     Status: Abnormal   Collection Time: 06/05/19  1:23 PM  Result Value Ref Range   Opiates NONE DETECTED NONE DETECTED   Cocaine NONE DETECTED NONE DETECTED   Benzodiazepines NONE DETECTED NONE DETECTED   Amphetamines POSITIVE (A) NONE DETECTED   Tetrahydrocannabinol POSITIVE (A) NONE DETECTED   Barbiturates NONE DETECTED NONE DETECTED    Comment: (NOTE) DRUG SCREEN FOR MEDICAL PURPOSES ONLY.  IF CONFIRMATION IS NEEDED FOR ANY PURPOSE, NOTIFY LAB WITHIN 5 DAYS. LOWEST DETECTABLE LIMITS FOR  URINE DRUG SCREEN Drug Class                     Cutoff (ng/mL) Amphetamine and metabolites    1000 Barbiturate and metabolites    200 Benzodiazepine                 200 Tricyclics and metabolites     300 Opiates and metabolites        300 Cocaine and metabolites        300 THC                            50 Performed at [facility], [address] [place], [place] [address]   SARS Coronavirus 2 [facility](Hospital order, Performed in [facility] hospital lab) Nasopharyngeal Nasopharyngeal Swab     Status: None   Collection Time: 06/05/19  2:01 PM   Specimen: Nasopharyngeal Swab  Result Value Ref Range   SARS Coronavirus 2 NEGATIVE NEGATIVE    Comment: (NOTE) If result is NEGATIVE SARS-CoV-2 target nucleic acids are NOT DETECTED. The SARS-CoV-2 RNA is generally detectable in upper and lower  respiratory specimens during the acute phase of infection. The lowest  concentration of SARS-CoV-2 viral copies this assay can detect is 250  copies / mL. A negative result does not preclude SARS-CoV-2 infection  and should not be used as the sole basis for treatment or other  patient management decisions.  A negative result may occur with  improper specimen collection / handling, submission of specimen other  than nasopharyngeal swab, presence of viral mutation(s) within the  areas targeted by this assay, and inadequate number of viral copies  (<250 copies / mL). A negative result must be combined with clinical  observations, patient history, and epidemiological information. If result is POSITIVE SARS-CoV-2 target nucleic acids are DETECTED. The SARS-CoV-2 RNA is generally detectable in upper and lower  respiratory specimens dur ing the acute phase of infection.  Positive  results are indicative of active infection with SARS-CoV-2.  Clinical  correlation with patient history and other diagnostic information is  necessary to determine patient infection status.  Positive  results do   not rule out bacterial infection or co-infection with other viruses. If result is PRESUMPTIVE POSTIVE SARS-CoV-2 nucleic acids MAY BE PRESENT.   A presumptive positive result was obtained on the submitted specimen  and confirmed on repeat testing.  While 2019 novel coronavirus  (SARS-CoV-2) nucleic acids may be present in the submitted sample  additional confirmatory testing may be necessary for epidemiological  and / or clinical management purposes  to differentiate between  SARS-CoV-2 and other Sarbecovirus currently known to infect humans.  If clinically indicated additional testing with an alternate test  methodology [phone]) is advised. The SARS-CoV-2 RNA is generally  detectable in upper and lower respiratory sp ecimens during the acute  phase of infection. The expected result is Negative. Fact Sheet for Patients:  BoilerBrush.com.cy Fact Sheet for Healthcare Providers: https://pope.com/ This test is not yet approved or cleared by the [place] FDA and has been authorized for detection and/or diagnosis of SARS-CoV-2 by FDA under an Emergency Use Authorization (EUA).  This EUA will remain in effect (meaning this test can be used) for the duration of the COVID-19 declaration under Section 564(b)(1) of the Act, 21 U.S.C. section 360bbb-3(b)(1), unless the authorization is terminated or revoked sooner. Performed at [facility], [address], [place], [place] [address]     Blood Alcohol level:  Lab Results  Component Value Date   ETH <10 06/05/2019    Metabolic Disorder Labs:  No results found for: HGBA1C, MPG No results found for: PROLACTIN No results found for: CHOL, TRIG, HDL, CHOLHDL, VLDL, LDLCALC  Current Medications: Current Facility-Administered Medications  Medication Dose Route Frequency Provider Last Rate Last Dose  . acetaminophen (TYLENOL) tablet 650 mg  650 mg Oral Q6H PRN Jearld Lesch, NP      . Melene Muller ON 06/07/2019] darunavir (PREZISTA) tablet 800 mg  800 mg Oral Q breakfast Malvin Johns, MD      . FLUoxetine (PROZAC) capsule 20 mg  20 mg Oral Daily Malvin Johns, MD   20 mg at 06/06/19 1115  . gabapentin (NEURONTIN) capsule 300 mg  300 mg Oral TID Malvin Johns, MD   300 mg at 06/06/19 1204  . hydrOXYzine (ATARAX/VISTARIL) tablet 50 mg  50 mg Oral TID PRN Jearld Lesch, NP   50 mg at 06/05/19 2304  . nicotine polacrilex (NICORETTE) gum 2 mg  2 mg Oral PRN Cobos, Rockey Situ, MD      . Melene Muller ON 06/07/2019] ritonavir (NORVIR) tablet 100 mg  100 mg Oral Q breakfast Malvin Johns, MD      . temazepam (RESTORIL) capsule 30 mg  30 mg Oral QHS Malvin Johns, MD      . traZODone (DESYREL) tablet 50 mg  50 mg Oral QHS PRN Jearld Lesch, NP   50 mg at 06/05/19 2305   PTA Medications: Medications Prior to Admission  Medication Sig Dispense Refill Last Dose  . DESCOVY 200-25 MG tablet Take 1 tablet by mouth daily.     Marland Kitchen ibuprofen (ADVIL) 200 MG tablet Take 200 mg by mouth every 6 (six) hours as needed for headache or moderate pain.     . Melatonin 5 MG TABS Take 1 tablet by mouth at bedtime as needed (sleep).     . ondansetron (ZOFRAN ODT) 4 MG disintegrating tablet 4mg  ODT q4 hours prn nausea/vomit (Patient not taking: Reported on 06/05/2019) 12 tablet 0   . PREZISTA 800 MG tablet Take 1 tablet by mouth daily.     Marland Kitchen  ritonavir (NORVIR) 100 MG TABS tablet Take 1 tablet by mouth daily.     . traZODone (DESYREL) 50 MG tablet Take 50 mg by mouth at bedtime.       Musculoskeletal: Strength & Muscle Tone: within normal limits Gait & Station: normal Patient leans: N/A  Psychiatric Specialty Exam: Physical Exam  Nursing note and vitals reviewed. Constitutional: He is oriented to person, place, and time. He appears well-developed and well-nourished.  Cardiovascular: Normal rate.  Respiratory: Effort normal.  Neurological: He is alert and oriented to person, place, and time.     Review of Systems  Constitutional: Negative.   Psychiatric/Behavioral: Positive for depression, hallucinations, substance abuse (meth, THC) and suicidal ideas. The patient is not nervous/anxious and does not have insomnia.     Blood pressure 109/80, pulse 63, temperature 98.2 F (36.8 C), temperature source Oral, resp. rate 16, height 5\' 6"  (1.676 m), weight 76.7 kg, SpO2 100 %.Body mass index is 27.28 kg/m.  General Appearance: Casual  Eye Contact:  Fair  Speech:  Slow  Volume:  Decreased  Mood:  Depressed  Affect:  Congruent  Thought Process:  Coherent  Orientation:  Full (Time, Place, and Person)  Thought Content:  Rumination  Suicidal Thoughts:  Yes.  without intent/plan  Homicidal Thoughts:  No  Memory:  Immediate;   Poor Recent;   Poor  Judgement:  Intact  Insight:  Fair  Psychomotor Activity:  Normal  Concentration:  Concentration: Fair and Attention Span: Fair  Recall:  [employer]  Fund of Knowledge:  Fair  Language:  Fair  Akathisia:  No  Handed:  Right  AIMS (if indicated):     Assets:  Financial Resources/Insurance Leisure Time Resilience  ADL's:  Intact  Cognition:  WNL  Sleep:  Number of Hours: 6.25    Treatment Plan Summary: Daily contact with patient to assess and evaluate symptoms and progress in treatment and Medication management   Inpatient hospitalization.  Start Effexor XR 75 mg PO daily for mood Start Risperdal 1 mg PO BID for AH/mood instability Start Remeron 15 mg PO QHS for mood/sleep Continue Prezista 800 mg PO daily and retonavir 100 mg PO daily for HIV Continue Vistaril 50 mg PO TID PRN anxiety Continue trazodone 50 mg PO QHS PRN insomnia  Patient will participate in the therapeutic group milieu.  Discharge disposition in progress.   Observation Level/Precautions:  15 minute checks  Laboratory:  a1c lipid panel TSH  Psychotherapy:  Group therapy  Medications:  See MAR  Consultations:  PRN  Discharge Concerns:  Safety and stabilization   Estimated LOS: 3-5 days  Other:     Physician Treatment Plan for Primary Diagnosis: <principal problem not specified> Long Term Goal(s): Improvement in symptoms so as ready for discharge  Short Term Goals: Ability to identify changes in lifestyle to reduce recurrence of condition will improve, Ability to verbalize feelings will improve and Ability to disclose and discuss suicidal ideas  Physician Treatment Plan for Secondary Diagnosis: Active Problems:   MDD (major depressive disorder), recurrent, severe, with psychosis (HCC)  Long Term Goal(s): Improvement in symptoms so as ready for discharge  Short Term Goals: Ability to demonstrate self-control will improve and Ability to identify and develop effective coping behaviors will improve  I certify that inpatient services furnished can reasonably be expected to improve the patient's condition.    Aldean BakerJanet E Nevin Grizzle, NP 8/6/202012:58 PM

## 2019-06-06 NOTE — [facility] Suicide Risk Assessment (Signed)
[facility] Admission Suicide Risk Assessment   Nursing information obtained from:    Demographic factors:  Male, Adolescent or young adult, Low socioeconomic status Current Mental Status:  Suicidal ideation indicated by patient Loss Factors:  Financial problems / change in socioeconomic status Historical Factors:  Prior suicide attempts Risk Reduction Factors:  Positive social support  Total Time spent with patient: 45 minutes Principal Problem: Issues of secondary gain/substance abuse/endorsing hallucinations then denies them/reporting suicidal thoughts Diagnosis:  Active Problems:   MDD (major depressive disorder), recurrent, severe, with psychosis ([place])  Subjective Data: 33 year old homeless individual stating he has stopped his HIV meds and a form of self-harm abusing cannabis and methamphetamines drug screen showing these compounds  Continued Clinical Symptoms:  Alcohol Use Disorder Identification Test Final Score (AUDIT): 2 The "Alcohol Use Disorders Identification Test", Guidelines for Use in Primary Care, Second Edition.  World Pharmacologist [facility]). Score between 0-7:  no or low risk or alcohol related problems. Score between 8-15:  moderate risk of alcohol related problems. Score between 16-19:  high risk of alcohol related problems. Score 20 or above:  warrants further diagnostic evaluation for alcohol dependence and treatment.   CLINICAL FACTORS:   Alcohol/Substance Abuse/Dependencies   Musculoskeletal: Strength & Muscle Tone: within normal limits Gait & Station: normal Patient leans: N/A  Psychiatric Specialty Exam: Physical Exam  ROS  Blood pressure 109/80, pulse 63, temperature 98.2 F (36.8 C), temperature source Oral, resp. rate 16, height 5\' 6"  (1.676 m), weight 76.7 kg, SpO2 100 %.Body mass index is 27.28 kg/m.  General Appearance: Casual  Eye Contact:  Fair  Speech:  Slow  Volume:  Decreased  Mood:  Dysphoric  Affect:  Appropriate and Congruent  Thought  Process:  Coherent and Descriptions of Associations: Circumstantial  Orientation:  Full (Time, Place, and Person)  Thought Content:  Logical and Rumination  Suicidal Thoughts:  Yes.  without intent/plan  Homicidal Thoughts:  No  Memory:  Immediate;   Fair  Judgement:  Fair  Insight:  Good and Fair  Psychomotor Activity:  Normal  Concentration:  Concentration: Fair  Recall:  [employer] of Knowledge:  Fair  Language:  Fair  Akathisia:  Negative  Handed:  Right  AIMS (if indicated):     Assets:  Resilience Social Support  ADL's:  Intact  Cognition:  WNL  Sleep:  Number of Hours: 6.25      COGNITIVE FEATURES THAT CONTRIBUTE TO RISK:  Polarized thinking    SUICIDE RISK:   Moderate:  Frequent suicidal ideation with limited intensity, and duration, some specificity in terms of plans, no associated intent, good self-control, limited dysphoria/symptomatology, some risk factors present, and identifiable protective factors, including available and accessible social support.  PLAN OF CARE: Admit for stabilization antidepressant therapy full work-up to follow  I certify that inpatient services furnished can reasonably be expected to improve the patient's condition.   Johnn Hai, MD 06/06/2019, 10:32 AM

## 2019-06-07 LAB — LIPID PANEL
Cholesterol: 155 mg/dL (ref 0–200)
HDL: 52 mg/dL (ref >40)
LDL Cholesterol: 90 mg/dL (ref 0–99)
Total CHOL/HDL Ratio: 3 RATIO
Triglycerides: 66 mg/dL (ref <150)
VLDL: 13 mg/dL (ref 0–40)

## 2019-06-07 LAB — TSH: TSH: 0.283 u[IU]/mL — ABNORMAL LOW (ref 0.350–4.500)

## 2019-06-07 LAB — HEMOGLOBIN A1C
Hgb A1c MFr Bld: 5.2 % (ref 4.8–5.6)
Mean Plasma Glucose: 102.54 mg/dL

## 2019-06-07 MED ORDER — EMTRICITABINE-TENOFOVIR AF 200-25 MG PO TABS
1.0000 | ORAL_TABLET | Freq: Every day | ORAL | Status: DC
  Administered 2019-06-08 – 2019-06-11 (×4): 1 via ORAL
  Filled 2019-06-07: qty 1
  Filled 2019-06-07: qty 2
  Filled 2019-06-07: qty 1
  Filled 2019-06-07: qty 2
  Filled 2019-06-07 (×3): qty 1

## 2019-06-07 MED ORDER — VENLAFAXINE HCL ER 150 MG PO CP24
150.0000 mg | ORAL_CAPSULE | Freq: Every day | ORAL | Status: DC
  Administered 2019-06-07 – 2019-06-11 (×5): 150 mg via ORAL
  Filled 2019-06-07: qty 1
  Filled 2019-06-07: qty 7
  Filled 2019-06-07 (×3): qty 1
  Filled 2019-06-07: qty 7
  Filled 2019-06-07: qty 1

## 2019-06-07 NOTE — Progress Notes (Signed)

## 2019-06-07 NOTE — Progress Notes (Signed)
[facility]  06/07/2019 8:08 AM Daniel Donaldson  MRN:  [id number] Subjective:   Patient slept fair he describes his auditory hallucinations as principally "inside" his head so I think they qualify more as internal ruminations or intrusive thoughts but he still has thoughts of not wanting to be here and passively suicidal by not taking his HIV medications.  He also has issues of secondary gain, is homeless, [place].  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  He has vague thoughts of not wanting to be here but can contract for safety and understands what that means. Principal Problem: Depression, substance abuse, homelessness Diagnosis: Active Problems:   MDD (major depressive disorder), recurrent, severe, with psychosis (HCC)  Total Time spent with patient: 20 minutes  Past Psychiatric History: see eval  Past Medical History:  Past Medical History:  Diagnosis Date  . HIV (human immunodeficiency virus infection) (HCC)   . PML (progressive multifocal leukoencephalopathy) (HCC)     Past Surgical History:  Procedure Laterality Date  . CONDYLOMA EXCISION/FULGURATION     Family History: History reviewed. No pertinent family history. Family Psychiatric  History: see eval Social History:  Social History   Substance and Sexual Activity  Alcohol Use Yes     Social History   Substance and Sexual Activity  Drug Use Yes  . Types: Methamphetamines    Social History   Socioeconomic History  . Marital status: Single    Spouse name: Not on file  . Number of children: Not on file  . Years of education: Not on file  . Highest education level: Not on file  Occupational History  . Not on file  Social Needs  . Financial resource strain: Not on file  . Food insecurity    Worry: Not on file    Inability: Not on file  . Transportation  needs    Medical: Not on file    Non-medical: Not on file  Tobacco Use  . Smoking status: Current Every Day Smoker  . Smokeless tobacco: Never Used  Substance and Sexual Activity  . Alcohol use: Yes  . Drug use: Yes    Types: Methamphetamines  . Sexual activity: Yes  Lifestyle  . Physical activity    Days per week: Not on file    Minutes per session: Not on file  . Stress: Not on file  Relationships  . Social Musicianconnections    Talks on phone: Not on file    Gets together: Not on file    Attends religious service: Not on file    Active member of club or organization: Not on file    Attends meetings of clubs or organizations: Not on file    Relationship status: Not on file  Other Topics Concern  . Not on file  Social History Narrative  . Not on file   Additional Social History:                         Sleep: Good  Appetite:  Good  Current Medications: Current Facility-Administered Medications  Medication Dose Route Frequency Provider Last Rate Last Dose  . acetaminophen (TYLENOL) tablet 650 mg  650 mg Oral Q6H PRN Jearld Leschixon, Rashaun M, NP   650 mg at 06/06/19 2005  . darunavir (PREZISTA) tablet 800 mg  800 mg Oral Q breakfast  Johnn Hai, MD      . hydrOXYzine (ATARAX/VISTARIL) tablet 50 mg  50 mg Oral TID PRN Deloria Lair, NP   50 mg at 06/06/19 2005  . mirtazapine (REMERON) tablet 15 mg  15 mg Oral QHS Connye Burkitt, NP   15 mg at 06/06/19 2005  . nicotine polacrilex (NICORETTE) gum 2 mg  2 mg Oral PRN Cobos, Myer Peer, MD      . risperiDONE (RISPERDAL) tablet 1 mg  1 mg Oral BID Connye Burkitt, NP   1 mg at 06/06/19 1705  . ritonavir (NORVIR) tablet 100 mg  100 mg Oral Q breakfast Johnn Hai, MD      . traZODone (DESYREL) tablet 50 mg  50 mg Oral QHS PRN Deloria Lair, NP   50 mg at 06/06/19 2005  . [START ON 06/08/2019] venlafaxine XR (EFFEXOR-XR) 24 hr capsule 150 mg  150 mg Oral Q breakfast Johnn Hai, MD        Lab Results:  Results for orders  placed or performed during the hospital encounter of 06/05/19 (from the past 48 hour(s))  Hemoglobin A1c     Status: None   Collection Time: 06/07/19  6:34 AM  Result Value Ref Range   Hgb A1c MFr Bld 5.2 4.8 - 5.6 %    Comment: (NOTE) Pre diabetes:          5.7%-6.4% Diabetes:              >6.4% Glycemic control for   <7.0% adults with diabetes    Mean Plasma Glucose 102.54 mg/dL    Comment: Performed at [facility], [place] [address], [place], [place] [address]  TSH     Status: Abnormal   Collection Time: 06/07/19  6:34 AM  Result Value Ref Range   TSH 0.283 (L) 0.350 - 4.500 uIU/mL    Comment: Performed by a 3rd Generation assay with a functional sensitivity of <=0.01 uIU/mL. Performed at [facility], [place] [address], [place], [place] [address]     Blood Alcohol level:  Lab Results  Component Value Date   ETH <10 28/31/5176    Metabolic Disorder Labs: Lab Results  Component Value Date   HGBA1C 5.2 06/07/2019   MPG 102.54 06/07/2019   No results found for: PROLACTIN No results found for: CHOL, TRIG, HDL, CHOLHDL, VLDL, LDLCALC  Physical Findings: AIMS: Facial and Oral Movements Muscles of Facial Expression: None, normal Lips and Perioral Area: None, normal Jaw: None, normal Tongue: None, normal,Extremity Movements Upper (arms, wrists, hands, fingers): None, normal Lower (legs, knees, ankles, toes): None, normal, Trunk Movements Neck, shoulders, hips: None, normal, Overall Severity Severity of abnormal movements (highest score from questions above): None, normal Incapacitation due to abnormal movements: None, normal Patient's awareness of abnormal movements (rate only patient's report): No Awareness, Dental Status Current problems with teeth and/or dentures?: No Does patient usually wear dentures?: No  CIWA:    COWS:     Musculoskeletal: Strength & Muscle Tone: within normal limits Gait & Station: normal Patient leans:  N/A  Psychiatric Specialty Exam: Physical Exam  ROS  Blood pressure 106/76, pulse 67, temperature 98.4 F (36.9 C), temperature source Oral, resp. rate 16, height 5\' 6"  (1.676 m), weight 76.7 kg, SpO2 100 %.Body mass index is 27.28 kg/m.  General Appearance: Casual  Eye Contact:  Good  Speech:  Clear and Coherent  Volume:  Normal  Mood:  Dysphoric  Affect:  Appropriate  Thought Process:  Coherent and Descriptions of Associations: Intact  Orientation:  Full (Time, Place, and Person)  Thought Content:  Logical and Rumination  Suicidal Thoughts:  Yes.  without intent/plan  Homicidal Thoughts:  No  Memory:  Immediate;   Good  Judgement:  Good  Insight:  Good  Psychomotor Activity:  Normal  Concentration:  Concentration: Good  Recall:  Good  Fund of Knowledge:  Good  Language:  Good  Akathisia:  Negative  Handed:  Right  AIMS (if indicated):     Assets:  Leisure Time Physical Health  ADL's:  Intact  Cognition:  WNL  Sleep:  Number of Hours: 6.5     Treatment Plan Summary: Daily contact with patient to assess and evaluate symptoms and progress in treatment and Medication management explained that there is no specific detox for methamphetamines but we will continue to monitor for any signs or symptoms of withdrawal we will continue to keep him on every 15 minute precautions we have resumed HIV meds will escalate venlafaxine for depression help him facilitate [place] placement by Monday  Malvin JohnsFARAH,Ivalee Strauser, MD 06/07/2019, 8:08 AM

## 2019-06-07 NOTE — Tx Team (Signed)
Interdisciplinary Treatment and Diagnostic Plan Update  06/07/2019 Time of Session: 9:00am Daniel Donaldson MRN: [id number]  Principal Diagnosis: <principal problem not specified>  Secondary Diagnoses: Active Problems:   MDD (major depressive disorder), recurrent, severe, with psychosis (HCC)   Current Medications:  Current Facility-Administered Medications  Medication Dose Route Frequency Provider Last Rate Last Dose  . acetaminophen (TYLENOL) tablet 650 mg  650 mg Oral Q6H PRN Jearld Leschixon, Rashaun M, NP   650 mg at 06/06/19 2005  . darunavir (PREZISTA) tablet 800 mg  800 mg Oral Q breakfast Malvin JohnsFarah, Brian, MD   800 mg at 06/07/19 [id number]  . hydrOXYzine (ATARAX/VISTARIL) tablet 50 mg  50 mg Oral TID PRN Jearld Leschixon, Rashaun M, NP   50 mg at 06/06/19 2005  . mirtazapine (REMERON) tablet 15 mg  15 mg Oral QHS Aldean BakerSykes, Janet E, NP   15 mg at 06/06/19 2005  . nicotine polacrilex (NICORETTE) gum 2 mg  2 mg Oral PRN Cobos, Rockey SituFernando A, MD      . risperiDONE (RISPERDAL) tablet 1 mg  1 mg Oral BID Aldean BakerSykes, Janet E, NP   1 mg at 06/07/19 [id number]  . ritonavir (NORVIR) tablet 100 mg  100 mg Oral Q breakfast Malvin JohnsFarah, Brian, MD   100 mg at 06/07/19 0824  . traZODone (DESYREL) tablet 50 mg  50 mg Oral QHS PRN Jearld Leschixon, Rashaun M, NP   50 mg at 06/06/19 2005  . [START ON 06/08/2019] venlafaxine XR (EFFEXOR-XR) 24 hr capsule 150 mg  150 mg Oral Q breakfast Malvin JohnsFarah, Brian, MD   150 mg at 06/07/19 [id number]   PTA Medications: Medications Prior to Admission  Medication Sig Dispense Refill Last Dose  . DESCOVY 200-25 MG tablet Take 1 tablet by mouth daily.     Marland Kitchen. ibuprofen (ADVIL) 200 MG tablet Take 200 mg by mouth every 6 (six) hours as needed for headache or moderate pain.     . Melatonin 5 MG TABS Take 1 tablet by mouth at bedtime as needed (sleep).     . ondansetron (ZOFRAN ODT) 4 MG disintegrating tablet 4mg  ODT q4 hours prn nausea/vomit (Patient not taking: Reported on 06/05/2019) 12 tablet 0   . PREZISTA 800 MG tablet Take 1 tablet by  mouth daily.     . ritonavir (NORVIR) 100 MG TABS tablet Take 1 tablet by mouth daily.     . traZODone (DESYREL) 50 MG tablet Take 50 mg by mouth at bedtime.       Patient Stressors: Financial difficulties Medication change or noncompliance Substance abuse  Patient Strengths: Ability for insight Active sense of humor General fund of knowledge Motivation for treatment/growth Supportive family/friends  Treatment Modalities: Medication Management, Group therapy, Case management,  1 to 1 session with clinician, Psychoeducation, Recreational therapy.   Physician Treatment Plan for Primary Diagnosis: <principal problem not specified> Long Term Goal(s): Improvement in symptoms so as ready for discharge Improvement in symptoms so as ready for discharge   Short Term Goals: Ability to identify changes in lifestyle to reduce recurrence of condition will improve Ability to verbalize feelings will improve Ability to disclose and discuss suicidal ideas Ability to demonstrate self-control will improve Ability to identify and develop effective coping behaviors will improve  Medication Management: Evaluate patient's response, side effects, and tolerance of medication regimen.  Therapeutic Interventions: 1 to 1 sessions, Unit Group sessions and Medication administration.  Evaluation of Outcomes: Progressing  Physician Treatment Plan for Secondary Diagnosis: Active Problems:   MDD (major depressive disorder), recurrent, severe, with  psychosis ([place])  Long Term Goal(s): Improvement in symptoms so as ready for discharge Improvement in symptoms so as ready for discharge   Short Term Goals: Ability to identify changes in lifestyle to reduce recurrence of condition will improve Ability to verbalize feelings will improve Ability to disclose and discuss suicidal ideas Ability to demonstrate self-control will improve Ability to identify and develop effective coping behaviors will improve      Medication Management: Evaluate patient's response, side effects, and tolerance of medication regimen.  Therapeutic Interventions: 1 to 1 sessions, Unit Group sessions and Medication administration.  Evaluation of Outcomes: Progressing   RN Treatment Plan for Primary Diagnosis: <principal problem not specified> Long Term Goal(s): Knowledge of disease and therapeutic regimen to maintain health will improve  Short Term Goals: Ability to identify and develop effective coping behaviors will improve and Compliance with prescribed medications will improve  Medication Management: RN will administer medications as ordered by provider, will assess and evaluate patient's response and provide education to patient for prescribed medication. RN will report any adverse and/or side effects to prescribing provider.  Therapeutic Interventions: 1 on 1 counseling sessions, Psychoeducation, Medication administration, Evaluate responses to treatment, Monitor vital signs and CBGs as ordered, Perform/monitor CIWA, COWS, AIMS and Fall Risk screenings as ordered, Perform wound care treatments as ordered.  Evaluation of Outcomes: Progressing   LCSW Treatment Plan for Primary Diagnosis: <principal problem not specified> Long Term Goal(s): Safe transition to appropriate next level of care at discharge, Engage patient in therapeutic group addressing interpersonal concerns.  Short Term Goals: Engage patient in aftercare planning with referrals and resources, Increase social support, Identify triggers associated with mental health/substance abuse issues and Increase skills for wellness and recovery  Therapeutic Interventions: Assess for all discharge needs, 1 to 1 time with Social worker, Explore available resources and support systems, Assess for adequacy in community support network, Educate family and significant other(s) on suicide prevention, Complete Psychosocial Assessment, Interpersonal group  therapy.  Evaluation of Outcomes: Progressing  Progress in Treatment: Attending groups: No. Participating in groups: No. Taking medication as prescribed: Yes. Toleration medication: Yes. Family/Significant other contact made: No, will contact:  declined  Patient understands diagnosis: Yes. Discussing patient identified problems/goals with staff: Yes. Medical problems stabilized or resolved: No. Denies suicidal/homicidal ideation: Yes. Issues/concerns per patient self-inventory: Yes.  New problem(s) identified: Yes, Describe:  homeless, malingering, no social supports  New Short Term/Long Term Goal(s):  medication management for mood stabilization; elimination of SI thoughts; development of comprehensive mental wellness/sobriety plan.  Patient Goals:  Wants housing.  Discharge Plan or Barriers: [facility]. Will need outpatient follow up for mental health and HIV at discharge. He in unsure if he will live in [facility] or [place] at discharge  Reason for Continuation of Hospitalization: Anxiety Depression  Estimated Length of Stay: 3 days  Attendees: Patient: Daniel Donaldson 06/07/2019 10:57 AM  Physician: Dr.Farah 06/07/2019 10:57 AM  Nursing: Chong Sicilian, RN 06/07/2019 10:57 AM  RN Care Manager: 06/07/2019 10:57 AM  Social Worker: Stephanie Acre, [place] 06/07/2019 10:57 AM  Recreational Therapist:  06/07/2019 10:57 AM  Other:  06/07/2019 10:57 AM  Other:  06/07/2019 10:57 AM  Other: 06/07/2019 10:57 AM    Scribe for Treatment Team: Joellen Jersey, [place] 06/07/2019 10:57 AM

## 2019-06-07 NOTE — Progress Notes (Signed)
D Patient is observed standing at the 300 hall med window this morning at med pass. He smiles brightly, makes good eye contact. HE wears hospital-issued patient scrubs. He endorses a flat, depressed affect.     A HE completed his daily assessment and on this he wrote he has experienced SI today. WHen writer approached him and asked him to clarify what he meant  ( by his "yes" answer to the question regarding having suicidality today)  he smiled - gamey-like_ and looked away from this Probation officer. When Probation officer explored further what he meant...could he be safe..he said " yeah   Yeah oh yeah Im not going to  Hurt myself". He rated his depression, hopelessness and anxiety " 8/8/8/", respectively.      R Safety is in place.

## 2019-06-08 DIAGNOSIS — R45851 Suicidal ideations: Secondary | ICD-10-CM

## 2019-06-08 DIAGNOSIS — Z21 Asymptomatic human immunodeficiency virus [HIV] infection status: Secondary | ICD-10-CM

## 2019-06-08 DIAGNOSIS — R443 Hallucinations, unspecified: Secondary | ICD-10-CM

## 2019-06-08 MED ORDER — RISPERIDONE 2 MG PO TABS
2.0000 mg | ORAL_TABLET | Freq: Every day | ORAL | Status: DC
  Administered 2019-06-08 – 2019-06-10 (×3): 2 mg via ORAL
  Filled 2019-06-08 (×2): qty 1
  Filled 2019-06-08: qty 7
  Filled 2019-06-08: qty 1
  Filled 2019-06-08: qty 7
  Filled 2019-06-08: qty 1

## 2019-06-08 MED ORDER — RISPERIDONE 1 MG PO TABS
1.0000 mg | ORAL_TABLET | Freq: Every day | ORAL | Status: DC
  Administered 2019-06-09 – 2019-06-11 (×3): 1 mg via ORAL
  Filled 2019-06-08 (×5): qty 1

## 2019-06-08 NOTE — Plan of Care (Signed)
  Problem: Activity: Goal: Interest or engagement in leisure activities will improve Outcome: Progressing   Problem: Coping: Goal: Coping ability will improve Outcome: Progressing   

## 2019-06-08 NOTE — Progress Notes (Signed)
[place] NOVEL CORONAVIRUS (COVID-19) DAILY CHECK-OFF SYMPTOMS - answer yes or no to each - every day NO YES  Have you had a fever in the past 24 hours?  . Fever (Temp > 37.80C / 100F) X   Have you had any of these symptoms in the past 24 hours? . New Cough .  Sore Throat  .  Shortness of Breath .  Difficulty Breathing .  Unexplained Body Aches   X   Have you had any one of these symptoms in the past 24 hours not related to allergies?   . Runny Nose .  Nasal Congestion .  Sneezing   X   If you have had runny nose, nasal congestion, sneezing in the past 24 hours, has it worsened?  X   EXPOSURES - check yes or no X   Have you traveled outside the state in the past 14 days?  X   Have you been in contact with someone with a confirmed diagnosis of COVID-19 or PUI in the past 14 days without wearing appropriate PPE?  X   Have you been living in the same home as a person with confirmed diagnosis of COVID-19 or a PUI (household contact)?    X   Have you been diagnosed with COVID-19?    X              What to do next: Answered NO to all: Answered YES to anything:   Proceed with unit schedule Follow the [facility] Inpatient Flowsheet.   

## 2019-06-08 NOTE — Progress Notes (Signed)
D. Pt observed sitting in the milieu this am- friendly, smiles upon approach- interacts well with peers and staff. Per pt's self inventory, pt rates his depression, hopelessness and anxiety all 7's today. Pt writes that his goal today is "try not to sleep".Pt currently denies SI/HI and AVH and   A. Labs and vitals monitored. Pt with medications. Pt supported emotionally and encouraged to express concerns and ask questions.   R. Pt remains safe with 15 minute checks. Will continue POC.

## 2019-06-08 NOTE — Progress Notes (Signed)
Adult Psychoeducational Group Note  Date:  06/08/2019 Time:  10:40 PM  Group Topic/Focus:  Wrap-Up Group:   The focus of this group is to help patients review their daily goal of treatment and discuss progress on daily workbooks.  Participation Level:  Active  Participation Quality:  Appropriate and Attentive  Affect:  Appropriate  Cognitive:  Alert, Appropriate and Oriented  Insight: Appropriate  Engagement in Group:  Engaged  Modes of Intervention:  Discussion and Education  Additional Comments:  Pt attended and participated in group. Pt stated his goal today was to stay awake and participate more. Pt reported completing his goal and rated his day a 7/10.  Milus Glazier 06/08/2019, 10:40 PM

## 2019-06-08 NOTE — Progress Notes (Addendum)
[facility]  06/08/2019 12:55 PM Daniel Donaldson  MRN:  [id number]   Subjective: Patient reports today that he is feeling a little bit better.  He states that he is still having difficulty with sleep at night and he has been taking his medications.  He states he is still having some auditory hallucinations and reports that he has some vague suicidal thoughts.  He states that his plan was to stop taking his HIV medications and now states that when he does take his medications the thoughts of wanting to harm himself go away.  He states currently he does not have a plan to harm himself and that the suicidal thoughts are getting better.  He states he is just concerned about continuing to have hallucinations and issues with sleep.  Patient denies any medication side effects.  Patient reports that his appetite has improved.  He continues to state that his biggest stressors is that he does not have a supportive family and that he is homeless.  Patient reports that he spoke to his family 2 to 3 weeks ago but feels that when he gets around them that his symptoms become worse because they are not very supportive of him.  He denies any homicidal ideations.  Objective: Patient's chart and findings reviewed and discussed with treatment team.  Patient presents in the day room and is interacting with peers and staff appropriately.  Patient is pleasant, calm, cooperative and is watching TV.  Patient does not appear to be responding to any type of internal or external stimuli.  Based on patient's complaints of hallucinations on and off as well as vague SI and difficulty with sleep feel that it may be necessary to increase his nighttime dose of Seroquel.  He will continue endorsing depression and anxiety with both at a 7 out of 10 with 10 being the worst.  Principal Problem: <principal problem not specified> Diagnosis: Active Problems:   MDD (major depressive disorder), recurrent, severe, with psychosis  (HCC)  Total Time spent with patient: 30 minutes  Past Psychiatric History: See H&P  Past Medical History:  Past Medical History:  Diagnosis Date  . HIV (human immunodeficiency virus infection) (HCC)   . PML (progressive multifocal leukoencephalopathy) (HCC)     Past Surgical History:  Procedure Laterality Date  . CONDYLOMA EXCISION/FULGURATION     Family History: History reviewed. No pertinent family history. Family Psychiatric  History: See H&P Social History:  Social History   Substance and Sexual Activity  Alcohol Use Yes     Social History   Substance and Sexual Activity  Drug Use Yes  . Types: Methamphetamines    Social History   Socioeconomic History  . Marital status: Single    Spouse name: Not on file  . Number of children: Not on file  . Years of education: Not on file  . Highest education level: Not on file  Occupational History  . Not on file  Social Needs  . Financial resource strain: Not on file  . Food insecurity    Worry: Not on file    Inability: Not on file  . Transportation needs    Medical: Not on file    Non-medical: Not on file  Tobacco Use  . Smoking status: Current Every Day Smoker  . Smokeless tobacco: Never Used  Substance and Sexual Activity  . Alcohol use: Yes  . Drug use: Yes    Types: Methamphetamines  . Sexual activity: Yes  Lifestyle  .  Physical activity    Days per week: Not on file    Minutes per session: Not on file  . Stress: Not on file  Relationships  . Social Musicianconnections    Talks on phone: Not on file    Gets together: Not on file    Attends religious service: Not on file    Active member of club or organization: Not on file    Attends meetings of clubs or organizations: Not on file    Relationship status: Not on file  Other Topics Concern  . Not on file  Social History Narrative  . Not on file   Additional Social History:                         Sleep: Fair  Appetite:  Good  Current  Medications: Current Facility-Administered Medications  Medication Dose Route Frequency Provider Last Rate Last Dose  . acetaminophen (TYLENOL) tablet 650 mg  650 mg Oral Q6H PRN Jearld Leschixon, Rashaun M, NP   650 mg at 06/06/19 2005  . darunavir (PREZISTA) tablet 800 mg  800 mg Oral Q breakfast Malvin JohnsFarah, Brian, MD   800 mg at 06/08/19 [id number]  . emtricitabine-tenofovir AF (DESCOVY) 200-25 MG per tablet 1 tablet  1 tablet Oral Daily Malvin JohnsFarah, Brian, MD   1 tablet at 06/08/19 [phone]  . hydrOXYzine (ATARAX/VISTARIL) tablet 50 mg  50 mg Oral TID PRN Jearld Leschixon, Rashaun M, NP   50 mg at 06/07/19 2105  . mirtazapine (REMERON) tablet 15 mg  15 mg Oral QHS Aldean BakerSykes, Janet E, NP   15 mg at 06/07/19 2103  . nicotine polacrilex (NICORETTE) gum 2 mg  2 mg Oral PRN Annaliese Saez, Rockey SituFernando A, MD      . Melene Muller[START ON 06/09/2019] risperiDONE (RISPERDAL) tablet 1 mg  1 mg Oral Daily Money, Gerlene Burdockravis B, FNP      . risperiDONE (RISPERDAL) tablet 2 mg  2 mg Oral QHS Money, Gerlene Burdockravis B, FNP      . ritonavir (NORVIR) tablet 100 mg  100 mg Oral Q breakfast Malvin JohnsFarah, Brian, MD   100 mg at 06/08/19 [id number]  . traZODone (DESYREL) tablet 50 mg  50 mg Oral QHS PRN Jearld Leschixon, Rashaun M, NP   50 mg at 06/07/19 2105  . venlafaxine XR (EFFEXOR-XR) 24 hr capsule 150 mg  150 mg Oral Q breakfast Malvin JohnsFarah, Brian, MD   150 mg at 06/08/19 [id number]    Lab Results:  Results for orders placed or performed during the hospital encounter of 06/05/19 (from the past 48 hour(s))  Hemoglobin A1c     Status: None   Collection Time: 06/07/19  6:34 AM  Result Value Ref Range   Hgb A1c MFr Bld 5.2 4.8 - 5.6 %    Comment: (NOTE) Pre diabetes:          5.7%-6.4% Diabetes:              >6.4% Glycemic control for   <7.0% adults with diabetes    Mean Plasma Glucose 102.54 mg/dL    Comment: Performed at [facility], [address] [place], [place] [address]  Lipid panel     Status: None   Collection Time: 06/07/19  6:34 AM  Result Value Ref Range   Cholesterol 155 0 - 200 mg/dL   Triglycerides  66 <956<150 mg/dL   HDL 52 >21>40 mg/dL   Total CHOL/HDL Ratio 3.0 RATIO   VLDL 13 0 - 40 mg/dL   LDL  Cholesterol 90 0 - 99 mg/dL    Comment:        Total Cholesterol/HDL:CHD Risk Coronary Heart Disease Risk Table                     Men   Women  1/2 Average Risk   3.4   3.3  Average Risk       5.0   4.4  2 X Average Risk   9.6   7.1  3 X Average Risk  23.4   11.0        Use the calculated Patient Ratio above and the CHD Risk Table to determine the patient's CHD Risk.        ATP III CLASSIFICATION (LDL):  <100     mg/dL   Optimal  100-129  mg/dL   Near or Above                    Optimal  130-159  mg/dL   Borderline  160-189  mg/dL   High  >190     mg/dL   Very High Performed at [facility] [address] [place], [place] [address]   TSH     Status: Abnormal   Collection Time: 06/07/19  6:34 AM  Result Value Ref Range   TSH 0.283 (L) 0.350 - 4.500 uIU/mL    Comment: Performed by a 3rd Generation assay with a functional sensitivity of <=0.01 uIU/mL. Performed at [facility], [address], [place], [place] [address]     Blood Alcohol level:  Lab Results  Component Value Date   ETH <10 36/64/4034    Metabolic Disorder Labs: Lab Results  Component Value Date   HGBA1C 5.2 06/07/2019   MPG 102.54 06/07/2019   No results found for: PROLACTIN Lab Results  Component Value Date   CHOL 155 06/07/2019   TRIG 66 06/07/2019   HDL 52 06/07/2019   CHOLHDL 3.0 06/07/2019   VLDL 13 06/07/2019   LDLCALC 90 06/07/2019    Physical Findings: AIMS: Facial and Oral Movements Muscles of Facial Expression: None, normal Lips and Perioral Area: None, normal Jaw: None, normal Tongue: None, normal,Extremity Movements Upper (arms, wrists, hands, fingers): None, normal Lower (legs, knees, ankles, toes): None, normal, Trunk Movements Neck, shoulders, hips: None, normal, Overall Severity Severity of abnormal movements (highest score from  questions above): None, normal Incapacitation due to abnormal movements: None, normal Patient's awareness of abnormal movements (rate only patient's report): No Awareness, Dental Status Current problems with teeth and/or dentures?: No Does patient usually wear dentures?: No  CIWA:    COWS:     Musculoskeletal: Strength & Muscle Tone: within normal limits Gait & Station: normal Patient leans: N/A  Psychiatric Specialty Exam: Physical Exam  Nursing note and vitals reviewed. Constitutional: He is oriented to person, place, and time. He appears well-developed and well-nourished.  Cardiovascular: Normal rate.  Respiratory: Effort normal.  Musculoskeletal: Normal range of motion.  Neurological: He is alert and oriented to person, place, and time.    Review of Systems  Constitutional: Negative.   HENT: Negative.   Eyes: Negative.   Respiratory: Negative.   Cardiovascular: Negative.   Gastrointestinal: Negative.   Genitourinary: Negative.   Musculoskeletal: Negative.   Skin: Negative.   Neurological: Negative.   Endo/Heme/Allergies: Negative.   Psychiatric/Behavioral: Positive for depression, hallucinations and suicidal ideas. The patient is nervous/anxious.     Blood pressure 122/83, pulse 63, temperature 97.8 F (36.6  C), temperature source Oral, resp. rate 16, height 5\' 6"  (1.676 m), weight 76.7 kg, SpO2 100 %.Body mass index is 27.28 kg/m.  General Appearance: Casual  Eye Contact:  Good  Speech:  Clear and Coherent and Normal Rate  Volume:  Normal  Mood:  Anxious and Depressed  Affect:  Congruent  Thought Process:  Coherent and Descriptions of Associations: Intact  Orientation:  Full (Time, Place, and Person)  Thought Content:  Hallucinations: Auditory, but able to process them and realize they are hallucinations  Suicidal Thoughts:  Yes.  without intent/plan vague SI  Homicidal Thoughts:  No  Memory:  Immediate;   Good Recent;   Good Remote;   Good  Judgement:  Good   Insight:  Fair  Psychomotor Activity:  Normal  Concentration:  Concentration: Good  Recall:  Good  Fund of Knowledge:  Good  Language:  Good  Akathisia:  No  Handed:  Right  AIMS (if indicated):     Assets:  Communication Skills Desire for Improvement Financial Resources/Insurance Housing Resilience Social Support  ADL's:  Intact  Cognition:  WNL  Sleep:  Number of Hours: 6.75   Problems addressed MDD severe with psychotic features  Treatment Plan Summary: Daily contact with patient to assess and evaluate symptoms and progress in treatment, Medication management and Plan is to:  Continue Effexor X are 150 mg p.o. daily for MDD Continue trazodone 50 mg p.o. nightly as needed for insomnia Increase Risperdal 1 mg p.o. every morning and 2 mg p.o. nightly for psychosis Continue Remeron 15 mg p.o. nightly Continue Vistaril 50 mg p.o. 3 times daily as needed for anxiety Encourage group therapy participation Continue every 15 minute safety checks Social work will assist with housing and follow-up treatment  Maryfrances Bunnellravis B Money, FNP 06/08/2019, 12:55 PM   Attest to NP progress note

## 2019-06-08 NOTE — Progress Notes (Signed)
DAR NOTE: Patient presents with bight affect and calm mood.  Denies pain, auditory and visual hallucinations.  Maintained on routine safety checks.  Medications given as prescribed.  Support and encouragement offered as needed.  Will continue to monitor.

## 2019-06-09 MED ORDER — TRAZODONE HCL 50 MG PO TABS
50.0000 mg | ORAL_TABLET | Freq: Once | ORAL | Status: DC
  Filled 2019-06-09: qty 1

## 2019-06-09 NOTE — Progress Notes (Signed)
D. Pt pleasant on approach, appears brighter than yesterday evening.  Pt states that he would like a repeat of his trazodone ordered because he wakes up and has difficulty going back to sleep.  Pt observed up and appropriately engaged with peers on the unit, denies SI/HI/AVH at this time.  A. Support and encouragement offered, medication given as ordered.  NP notified of request for possible repeat trazodone and new order received.  R.  Pt remains safe on the unit, will continue to monitor.

## 2019-06-09 NOTE — Progress Notes (Signed)
D.  Pt guarded on approach, denies complaints at this time.  Pt was positive for evening wrap up group with appropriate interaction.  Pt denies SI/HI/AVH at this time.  A.  Support and encouragement offered, medication given as ordered  R.  Pt remains safe on the unit, will continue to monitor.

## 2019-06-09 NOTE — Progress Notes (Signed)
[place] NOVEL CORONAVIRUS (COVID-19) DAILY CHECK-OFF SYMPTOMS - answer yes or no to each - every day NO YES  Have you had a fever in the past 24 hours?  . Fever (Temp > 37.80C / 100F) X   Have you had any of these symptoms in the past 24 hours? . New Cough .  Sore Throat  .  Shortness of Breath .  Difficulty Breathing .  Unexplained Body Aches   X   Have you had any one of these symptoms in the past 24 hours not related to allergies?   . Runny Nose .  Nasal Congestion .  Sneezing   X   If you have had runny nose, nasal congestion, sneezing in the past 24 hours, has it worsened?  X   EXPOSURES - check yes or no X   Have you traveled outside the state in the past 14 days?  X   Have you been in contact with someone with a confirmed diagnosis of COVID-19 or PUI in the past 14 days without wearing appropriate PPE?  X   Have you been living in the same home as a person with confirmed diagnosis of COVID-19 or a PUI (household contact)?    X   Have you been diagnosed with COVID-19?    X              What to do next: Answered NO to all: Answered YES to anything:   Proceed with unit schedule Follow the [facility] Inpatient Flowsheet.   

## 2019-06-09 NOTE — Progress Notes (Signed)
D. Pt is friendly upon approach - reported some passive SI  this am, and endorses occasional AH, describing voices as "chatter". Pt agrees to contact staff before acting on any harmful thoughts. Pt writes that his goal today is "positive thinking" and "no sleeping".  A. Labs and vitals monitored. Pt compliant with medications. Pt supported emotionally and encouraged to express concerns and ask questions.   R. Pt remains safe with 15 minute checks. Will continue POC.

## 2019-06-09 NOTE — Progress Notes (Addendum)
[facility]  06/09/2019 2:18 PM Daniel Donaldson  MRN:  [id number]   Subjective: Patient states that he is feeling much better than yesterday.  He states that he only had some suicidal ideations first thing this morning but then that went away after he took his medications and ate breakfast.  He denies any homicidal ideations and denies any hallucinations.  He stated that the increased dose of Risperdal at bedtime seem to help him significantly as he slept better and the hallucinations are gone.  He denies any medication side effects and reports that his sleep and appetite have both improved.  He states that he feels that he is able to start working on other things that are issues for him and set up worrying about his depression and he has suicidal thoughts and hallucinations.  Objective: Patient's chart and findings reviewed and discussed with treatment team.  Patient is in the day room and has been seen interacting with peers and staff appropriately.  Patient has been coming out of his room more often today and has been compliant with his medications and treatment.  Patient did report some vague SI thoughts this morning immediately after waking but after taking medications and eating breakfast those thoughts went away and he was able to focus on other things for the day.  Patient is showing significant improvement compared to what was reported on admission.  Suspect that patient's ability discharge in the next 1 to 2 days granted he continues with his progression.  Principal Problem: <principal problem not specified> Diagnosis: Active Problems:   MDD (major depressive disorder), recurrent, severe, with psychosis (HCC)  Total Time spent with patient: 20 minutes  Past Psychiatric History: See H&P  Past Medical History:  Past Medical History:  Diagnosis Date  . HIV (human immunodeficiency virus infection) (HCC)   . PML (progressive multifocal leukoencephalopathy) (HCC)     Past Surgical  History:  Procedure Laterality Date  . CONDYLOMA EXCISION/FULGURATION     Family History: History reviewed. No pertinent family history. Family Psychiatric  History: See H&P Social History:  Social History   Substance and Sexual Activity  Alcohol Use Yes     Social History   Substance and Sexual Activity  Drug Use Yes  . Types: Methamphetamines    Social History   Socioeconomic History  . Marital status: Single    Spouse name: Not on file  . Number of children: Not on file  . Years of education: Not on file  . Highest education level: Not on file  Occupational History  . Not on file  Social Needs  . Financial resource strain: Not on file  . Food insecurity    Worry: Not on file    Inability: Not on file  . Transportation needs    Medical: Not on file    Non-medical: Not on file  Tobacco Use  . Smoking status: Current Every Day Smoker  . Smokeless tobacco: Never Used  Substance and Sexual Activity  . Alcohol use: Yes  . Drug use: Yes    Types: Methamphetamines  . Sexual activity: Yes  Lifestyle  . Physical activity    Days per week: Not on file    Minutes per session: Not on file  . Stress: Not on file  Relationships  . Social Musicianconnections    Talks on phone: Not on file    Gets together: Not on file    Attends religious service: Not on file    Active  member of club or organization: Not on file    Attends meetings of clubs or organizations: Not on file    Relationship status: Not on file  Other Topics Concern  . Not on file  Social History Narrative  . Not on file   Additional Social History:                         Sleep: Good  Appetite:  Good  Current Medications: Current Facility-Administered Medications  Medication Dose Route Frequency Provider Last Rate Last Dose  . acetaminophen (TYLENOL) tablet 650 mg  650 mg Oral Q6H PRN Deloria Lair, NP   650 mg at 06/06/19 2005  . darunavir (PREZISTA) tablet 800 mg  800 mg Oral Q breakfast  Johnn Hai, MD   800 mg at 06/09/19 0846  . emtricitabine-tenofovir AF (DESCOVY) 200-25 MG per tablet 1 tablet  1 tablet Oral Daily Johnn Hai, MD   1 tablet at 06/09/19 0846  . hydrOXYzine (ATARAX/VISTARIL) tablet 50 mg  50 mg Oral TID PRN Deloria Lair, NP   50 mg at 06/08/19 2102  . mirtazapine (REMERON) tablet 15 mg  15 mg Oral QHS Connye Burkitt, NP   15 mg at 06/08/19 2102  . nicotine polacrilex (NICORETTE) gum 2 mg  2 mg Oral PRN Jonia Oakey, Myer Peer, MD      . risperiDONE (RISPERDAL) tablet 1 mg  1 mg Oral Daily Donaldson, Lowry Ram, FNP   1 mg at 06/09/19 0845  . risperiDONE (RISPERDAL) tablet 2 mg  2 mg Oral QHS Donaldson, Lowry Ram, FNP   2 mg at 06/08/19 2102  . ritonavir (NORVIR) tablet 100 mg  100 mg Oral Q breakfast Johnn Hai, MD   100 mg at 06/09/19 0846  . traZODone (DESYREL) tablet 50 mg  50 mg Oral QHS PRN Deloria Lair, NP   50 mg at 06/08/19 2102  . venlafaxine XR (EFFEXOR-XR) 24 hr capsule 150 mg  150 mg Oral Q breakfast Johnn Hai, MD   150 mg at 06/09/19 0845    Lab Results:  No results found for this or any previous visit (from the past 72 hour(s)).  Blood Alcohol level:  Lab Results  Component Value Date   ETH <10 15/17/6160    Metabolic Disorder Labs: Lab Results  Component Value Date   HGBA1C 5.2 06/07/2019   MPG 102.54 06/07/2019   No results found for: PROLACTIN Lab Results  Component Value Date   CHOL 155 06/07/2019   TRIG 66 06/07/2019   HDL 52 06/07/2019   CHOLHDL 3.0 06/07/2019   VLDL 13 06/07/2019   LDLCALC 90 06/07/2019    Physical Findings: AIMS: Facial and Oral Movements Muscles of Facial Expression: None, normal Lips and Perioral Area: None, normal Jaw: None, normal Tongue: None, normal,Extremity Movements Upper (arms, wrists, hands, fingers): None, normal Lower (legs, knees, ankles, toes): None, normal, Trunk Movements Neck, shoulders, hips: None, normal, Overall Severity Severity of abnormal movements (highest score from  questions above): None, normal Incapacitation due to abnormal movements: None, normal Patient's awareness of abnormal movements (rate only patient's report): No Awareness, Dental Status Current problems with teeth and/or dentures?: No Does patient usually wear dentures?: No  CIWA:    COWS:     Musculoskeletal: Strength & Muscle Tone: within normal limits Gait & Station: normal Patient leans: N/A  Psychiatric Specialty Exam: Physical Exam  Nursing note and vitals reviewed. Constitutional: He is oriented to person, place,  and time. He appears well-developed and well-nourished.  Cardiovascular: Normal rate.  Respiratory: Effort normal.  Musculoskeletal: Normal range of motion.  Neurological: He is alert and oriented to person, place, and time.    Review of Systems  Constitutional: Negative.   HENT: Negative.   Eyes: Negative.   Respiratory: Negative.   Cardiovascular: Negative.   Gastrointestinal: Negative.   Genitourinary: Negative.   Musculoskeletal: Negative.   Skin: Negative.   Neurological: Negative.   Endo/Heme/Allergies: Negative.   Psychiatric/Behavioral: Positive for depression and suicidal ideas.    Blood pressure 114/76, pulse 79, temperature 98.4 F (36.9 C), temperature source Oral, resp. rate 18, height 5\' 6"  (1.676 m), weight 76.7 kg, SpO2 100 %.Body mass index is 27.28 kg/m.  General Appearance: Casual  Eye Contact:  Good  Speech:  Clear and Coherent and Normal Rate  Volume:  Normal  Mood:  Anxious and Depressed  Affect:  Congruent  Thought Process:  Coherent and Descriptions of Associations: Intact  Orientation:  Full (Time, Place, and Person)  Thought Content:  WDL  Suicidal Thoughts:  Yes.  without intent/plan early in the morning but not currently  Homicidal Thoughts:  No  Memory:  Immediate;   Good Recent;   Good Remote;   Good  Judgement:  Good  Insight:  Fair  Psychomotor Activity:  Normal  Concentration:  Concentration: Good  Recall:  Good   Fund of Knowledge:  Good  Language:  Good  Akathisia:  No  Handed:  Right  AIMS (if indicated):     Assets:  Communication Skills Desire for Improvement Financial Resources/Insurance Housing Resilience Social Support  ADL's:  Intact  Cognition:  WNL  Sleep:  Number of Hours: 6.75   Problems addressed MDD severe with psychotic features  Treatment Plan Summary: Daily contact with patient to assess and evaluate symptoms and progress in treatment, Medication management and Plan is to:  Continue Effexor X are 150 mg p.o. daily for MDD Continue trazodone 50 mg p.o. nightly as needed for insomnia Continue Risperdal 1 mg p.o. every morning and 2 mg p.o. nightly for psychosis Continue Remeron 15 mg p.o. nightly Continue Vistaril 50 mg p.o. 3 times daily as needed for anxiety Encourage group therapy participation Continue every 15 minute safety checks Social work will assist with housing and follow-up treatment  Daniel Bunnellravis B Money, FNP 06/09/2019, 2:18 PM   Attest to NP progress note

## 2019-06-10 NOTE — Progress Notes (Signed)
Patient ID: Daniel Donaldson, male   DOB: 11-20-1985, 33 y.o.   MRN: [id number]  Nursing Progress Note [id number]  Patient presents pleasant on approach with no concerns. Patient in no distress or withdrawal. Patient compliant with scheduled medications. Patient is seen attending groups and visible in the milieu. Patient currently denies SI/HI/AVH.  Patient safety maintained with q15 min safety checks. Medications provided as ordered.  Patient remains safe on the unit at this time. Will continue to support and monitor with plan of care.   Patient's self-inventory sheet Rated Energy Level  Normal  Rated Sleep  Fair  Rated Appetite  Fair  Rated Anxiety (0-10)  6  Rated Hopelessness (0-10)  6  Rated Depression (0-10)  6  Daily Goal  "being hopeful"  Any Additional Comments:

## 2019-06-10 NOTE — Progress Notes (Signed)
Patient rated his day as a 4 out of 10 since he had a "boring day". He states that his anxiety is very high and needs medicine. His goal for tomorrow is to get discharged and possibly go to treatment.

## 2019-06-10 NOTE — Progress Notes (Signed)
[facility]  06/10/2019 12:54 PM Daniel Donaldson  MRN:  [id number] Subjective:   Patient is compliant here he has not found housing he is relying on others to do this work for him he is encouraged to utilize [employer] resource [employer] he does not have thoughts of harming himself at this point in time.  Understands what it is to contract for safety and can do so Principal Problem: Depression/homelessness/HIV positive status Diagnosis: Active Problems:   MDD (major depressive disorder), recurrent, severe, with psychosis (HCC)  Total Time spent with patient: 20 minutes  Past Medical History:  Past Medical History:  Diagnosis Date  . HIV (human immunodeficiency virus infection) (HCC)   . PML (progressive multifocal leukoencephalopathy) (HCC)     Past Surgical History:  Procedure Laterality Date  . CONDYLOMA EXCISION/FULGURATION     Family History: History reviewed. No pertinent family history.  Social History:  Social History   Substance and Sexual Activity  Alcohol Use Yes     Social History   Substance and Sexual Activity  Drug Use Yes  . Types: Methamphetamines    Social History   Socioeconomic History  . Marital status: Single    Spouse name: Not on file  . Number of children: Not on file  . Years of education: Not on file  . Highest education level: Not on file  Occupational History  . Not on file  Social Needs  . Financial resource strain: Not on file  . Food insecurity    Worry: Not on file    Inability: Not on file  . Transportation needs    Medical: Not on file    Non-medical: Not on file  Tobacco Use  . Smoking status: Current Every Day Smoker  . Smokeless tobacco: Never Used  Substance and Sexual Activity  . Alcohol use: Yes  . Drug use: Yes    Types: Methamphetamines  . Sexual activity: Yes  Lifestyle  . Physical activity    Days per week: Not on file    Minutes per session: Not on file  . Stress: Not on file  Relationships  . Social  Musicianconnections    Talks on phone: Not on file    Gets together: Not on file    Attends religious service: Not on file    Active member of club or organization: Not on file    Attends meetings of clubs or organizations: Not on file    Relationship status: Not on file  Other Topics Concern  . Not on file  Social History Narrative  . Not on file   Additional Social History:                         Sleep: Good  Appetite:  Good  Current Medications: Current Facility-Administered Medications  Medication Dose Route Frequency Provider Last Rate Last Dose  . acetaminophen (TYLENOL) tablet 650 mg  650 mg Oral Q6H PRN Jearld Leschixon, Rashaun M, NP   650 mg at 06/09/19 1616  . darunavir (PREZISTA) tablet 800 mg  800 mg Oral Q breakfast Malvin JohnsFarah, Markos Theil, MD   800 mg at 06/10/19 0754  . emtricitabine-tenofovir AF (DESCOVY) 200-25 MG per tablet 1 tablet  1 tablet Oral Daily Malvin JohnsFarah, Dagmawi Venable, MD   1 tablet at 06/10/19 0754  . hydrOXYzine (ATARAX/VISTARIL) tablet 50 mg  50 mg Oral TID PRN Jearld Leschixon, Rashaun M, NP   50 mg at 06/09/19 2107  . mirtazapine (REMERON) tablet 15 mg  15 mg Oral QHS Connye Burkitt, NP   15 mg at 06/09/19 2107  . nicotine polacrilex (NICORETTE) gum 2 mg  2 mg Oral PRN Cobos, Myer Peer, MD   2 mg at 06/09/19 1616  . risperiDONE (RISPERDAL) tablet 1 mg  1 mg Oral Daily Money, Lowry Ram, FNP   1 mg at 06/10/19 0754  . risperiDONE (RISPERDAL) tablet 2 mg  2 mg Oral QHS Money, Lowry Ram, FNP   2 mg at 06/09/19 2108  . ritonavir (NORVIR) tablet 100 mg  100 mg Oral Q breakfast Johnn Hai, MD   100 mg at 06/10/19 0754  . traZODone (DESYREL) tablet 50 mg  50 mg Oral QHS PRN Deloria Lair, NP   50 mg at 06/09/19 2107  . traZODone (DESYREL) tablet 50 mg  50 mg Oral Once Caroline Sauger, NP      . venlafaxine XR (EFFEXOR-XR) 24 hr capsule 150 mg  150 mg Oral Q breakfast Johnn Hai, MD   150 mg at 06/10/19 9470    Lab Results: No results found for this or any previous visit (from the past  48 hour(s)).  Blood Alcohol level:  Lab Results  Component Value Date   ETH <10 96/28/3662    Metabolic Disorder Labs: Lab Results  Component Value Date   HGBA1C 5.2 06/07/2019   MPG 102.54 06/07/2019   No results found for: PROLACTIN Lab Results  Component Value Date   CHOL 155 06/07/2019   TRIG 66 06/07/2019   HDL 52 06/07/2019   CHOLHDL 3.0 06/07/2019   VLDL 13 06/07/2019   LDLCALC 90 06/07/2019    Physical Findings: AIMS: Facial and Oral Movements Muscles of Facial Expression: None, normal Lips and Perioral Area: None, normal Jaw: None, normal Tongue: None, normal,Extremity Movements Upper (arms, wrists, hands, fingers): None, normal Lower (legs, knees, ankles, toes): None, normal, Trunk Movements Neck, shoulders, hips: None, normal, Overall Severity Severity of abnormal movements (highest score from questions above): None, normal Incapacitation due to abnormal movements: None, normal Patient's awareness of abnormal movements (rate only patient's report): No Awareness, Dental Status Current problems with teeth and/or dentures?: No Does patient usually wear dentures?: No  CIWA:    COWS:     Musculoskeletal: Strength & Muscle Tone: within normal limits Gait & Station: normal Patient leans: N/A  Psychiatric Specialty Exam: Physical Exam  ROS  Blood pressure 120/70, pulse 90, temperature 98.4 F (36.9 C), temperature source Oral, resp. rate 18, height 5\' 6"  (1.676 m), weight 76.7 kg, SpO2 98 %.Body mass index is 27.28 kg/m.  General Appearance: Fairly Groomed  Eye Contact:  Good  Speech:  Clear and Coherent  Volume:  Normal  Mood:  Euthymic  Affect:  Appropriate and Congruent  Thought Process:  Coherent and Descriptions of Associations: Intact  Orientation:  Full (Time, Place, and Person)  Thought Content:  Logical  Suicidal Thoughts:  No  Homicidal Thoughts:  No  Memory:  Immediate;   Fair  Judgement:  Fair  Insight:  Fair  Psychomotor Activity:   Normal  Concentration:  Concentration: Fair  Recall:  [employer] of Knowledge:  Fair  Language:  Fair  Akathisia:  Negative  Handed:  Right  AIMS (if indicated):     Assets:  Physical Health Resilience Social Support  ADL's:  Intact  Cognition:  WNL  Sleep:  Number of Hours: 6.75     Treatment Plan Summary: Daily contact with patient to assess and evaluate symptoms and progress in  treatment, Medication management and Plan Continue current antidepressant therapy HIV therapy current precautions group therapy we will monitor on 15-minute checks discharge tomorrow as planned  [facility] Center[facility]ARAH,Daniel Tregre, MD 06/10/2019, 12:54 PM

## 2019-06-10 NOTE — Plan of Care (Signed)
  Problem: Education: Goal: Knowledge of [place] [facility] Education information/materials will improve Outcome: Progressing   Problem: Physical Regulation: Goal: Ability to maintain clinical measurements within normal limits will improve Outcome: Progressing   Problem: Safety: Goal: Periods of time without injury will increase Outcome: Progressing   Problem: Activity: Goal: Interest or engagement in activities will improve Outcome: Progressing

## 2019-06-10 NOTE — Progress Notes (Signed)
D: Pt passive SI-contracts for safety denies HI/AVH. Pt is pleasant and cooperative. Pt stated he felt better today A: Pt was offered support and encouragement. Pt was given scheduled medications. Pt was encourage to attend groups. Q 15 minute checks were done for safety.  R:Pt attends groups and interacts well with peers and staff. Pt is taking medication. Pt has no complaints.Pt receptive to treatment and safety maintained on unit.

## 2019-06-10 NOTE — BHH Group Notes (Signed)
LCSW Group Therapy Note   06/10/2019 2:06 PM   Type of Therapy and Topic:  Group Therapy:  Overcoming Obstacles   Participation Level:  Did Not Attend   Description of Group:    In this group patients will be encouraged to explore what they see as obstacles to their own wellness and recovery. They will be guided to discuss their thoughts, feelings, and behaviors related to these obstacles. The group will process together ways to cope with barriers, with attention given to specific choices patients can make. Each patient will be challenged to identify changes they are motivated to make in order to overcome their obstacles. This group will be process-oriented, with patients participating in exploration of their own experiences as well as giving and receiving support and challenge from other group members.   Therapeutic Goals: 1. Patient will identify personal and current obstacles as they relate to admission. 2. Patient will identify barriers that currently interfere with their wellness or overcoming obstacles.  3. Patient will identify feelings, thought process and behaviors related to these barriers. 4. Patient will identify two changes they are willing to make to overcome these obstacles:      Summary of Patient Progress x     Therapeutic Modalities:   Cognitive Behavioral Therapy Solution Focused Therapy Motivational Interviewing Relapse Prevention Therapy  Evalina Field, MSW, LCSW Clinical Social Work 06/10/2019 2:06 PM

## 2019-06-11 MED ORDER — DESCOVY 200-25 MG PO TABS: 1.0000 | ORAL_TABLET | Freq: Every day | ORAL | 0 refills | Status: DC

## 2019-06-11 MED ORDER — RITONAVIR 100 MG PO TABS: 100.0000 mg | ORAL_TABLET | Freq: Every day | ORAL | 0 refills | Status: DC

## 2019-06-11 MED ORDER — MIRTAZAPINE 15 MG PO TABS: 15.0000 mg | ORAL_TABLET | Freq: Every day | ORAL | 1 refills | Status: AC

## 2019-06-11 MED ORDER — DESCOVY 200-25 MG PO TABS: 1.0000 | ORAL_TABLET | Freq: Every day | ORAL | 0 refills | Status: AC

## 2019-06-11 MED ORDER — TRAZODONE HCL 50 MG PO TABS: 50.0000 mg | ORAL_TABLET | Freq: Every day | ORAL | 0 refills | Status: AC

## 2019-06-11 MED ORDER — RISPERIDONE 2 MG PO TABS: 2.0000 mg | ORAL_TABLET | Freq: Every day | ORAL | 1 refills | Status: AC

## 2019-06-11 MED ORDER — RITONAVIR 100 MG PO TABS: 100.0000 mg | ORAL_TABLET | Freq: Every day | ORAL | 0 refills | Status: AC

## 2019-06-11 MED ORDER — PREZISTA 800 MG PO TABS: 800.0000 mg | ORAL_TABLET | Freq: Every day | ORAL | 0 refills | Status: DC

## 2019-06-11 MED ORDER — TRAZODONE HCL 50 MG PO TABS: 50.0000 mg | ORAL_TABLET | Freq: Every day | ORAL | Status: DC

## 2019-06-11 MED ORDER — TRAZODONE HCL 50 MG PO TABS: 50.0000 mg | ORAL_TABLET | Freq: Every day | ORAL | 0 refills | Status: DC

## 2019-06-11 MED ORDER — VENLAFAXINE HCL ER 150 MG PO CP24: 150.0000 mg | ORAL_CAPSULE | Freq: Every day | ORAL | 1 refills | Status: AC

## 2019-06-11 MED ORDER — PREZISTA 800 MG PO TABS: 800.0000 mg | ORAL_TABLET | Freq: Every day | ORAL | 0 refills | Status: AC

## 2019-06-11 NOTE — Progress Notes (Signed)
Patient ID: Daniel Donaldson, male   DOB: 08-30-1986, 33 y.o.   MRN: [id number]   Patient is going to [facility].  CSW printed off his COVID-19 results and scheduled a kaizen lyft for 11am. Pt's nurse was notified.

## 2019-06-11 NOTE — Discharge Summary (Signed)
Physician Discharge Summary Note  Patient:  Daniel Donaldson is an 33 y.o., male MRN:  [id number] DOB:  October 07, 1986 Patient phone:  [phone] (home)  Patient address:   [place] [address],  Total Time spent with patient: 15 minutes  Date of Admission:  06/05/2019 Date of Discharge: 06/11/19  Reason for Admission:  suicidal ideation  Principal Problem: <principal problem not specified> Discharge Diagnoses: Active Problems:   MDD (major depressive disorder), recurrent, severe, with psychosis ([place])   Past Psychiatric History: History of depression, auditory hallucinations, methamphetamine use with multiple hospitalizations this year. He reports homelessness since February 2020. He reports suicide attempt via walking into traffic in May 2020. Denies history of mania.  Past Medical History:  Past Medical History:  Diagnosis Date  . HIV (human immunodeficiency virus infection) ([place])   . PML (progressive multifocal leukoencephalopathy) ([place])     Past Surgical History:  Procedure Laterality Date  . CONDYLOMA EXCISION/FULGURATION     Family History: History reviewed. No pertinent family history. Family Psychiatric  History: Nephew with schizophrenia. Social History:  Social History   Substance and Sexual Activity  Alcohol Use Yes     Social History   Substance and Sexual Activity  Drug Use Yes  . Types: Methamphetamines    Social History   Socioeconomic History  . Marital status: Single    Spouse name: Not on file  . Number of children: Not on file  . Years of education: Not on file  . Highest education level: Not on file  Occupational History  . Not on file  Social Needs  . Financial resource strain: Not on file  . Food insecurity    Worry: Not on file    Inability: Not on file  . Transportation needs    Medical: Not on file    Non-medical: Not on file  Tobacco Use  . Smoking status: Current Every Day Smoker  . Smokeless tobacco: Never  Used  Substance and Sexual Activity  . Alcohol use: Yes  . Drug use: Yes    Types: Methamphetamines  . Sexual activity: Yes  Lifestyle  . Physical activity    Days per week: Not on file    Minutes per session: Not on file  . Stress: Not on file  Relationships  . Social Herbalist on phone: Not on file    Gets together: Not on file    Attends religious service: Not on file    Active member of club or organization: Not on file    Attends meetings of clubs or organizations: Not on file    Relationship status: Not on file  Other Topics Concern  . Not on file  Social History Narrative  . Not on file    Hospital Course:  From admission H&P: Daniel Donaldson is a 33 year old homeless male with history of depression, methamphetamine use disorder, and HIV, presenting for treatment of suicidal ideation with auditory hallucinations. Patient reports he had gone to [facility] for an appointment and they sent him to the hospital due to [facility] and [facility]. He is significantly irritable during assessment and a poor historian, providing vague information and refusing to answer questions. He admits to multiple hospitalizations this year and states "I'm not sure" when asked about the number of hospitalizations. Most recent hospitalization was in [place], [place] in July 2020. He is unable to say what medications he was discharged on. He reports stopping all of his medications two weeks  ago including HIV medications, as a suicide attempt. He reports depression, SI, and CAH to kill himself for the last two weeks. He states [facility] with mood and auditory hallucinations until he stopped them. He admits to using meth once a month and says "I'm not sure" when asked about last use. UDS positive for amphetamines and THC. He denies VH and HI. He reports continuing SI with plan to stop his medications. He states "I'm not sure" when asked about triggers for depression/SI. Prior notes indicate he had  identified homelessness as main stressor and requested assistance with housing. He denies suicidal plan or intent on the unit. I called [facility]'s pharmacy, who confirmed patient's psychotropic medications- Remeron 15 mg QHS, Risperdal 1 mg BID, and Effexor XR 75 mg daily.  Daniel Donaldson was admitted for suicidal ideation with auditory hallucinations. [facility]. He was restarted on Remeron, Risperdal, and Effexor. He participated in group therapy on the unit. He responded well to treatment with no adverse effects reported. He has shown improved mood, affect, sleep, and interaction. CSW provided patient with information for housing resources, and [facility]. On day of discharge, he is calm and cooperative with bright affect and reporting good mood. He denies any SI/HI/AVH and contracts for safety. He is discharging on the medications listed below. He agrees [facility] (see below). He is provided with prescriptions and medication samples upon discharge. [facility] [place].  Physical Findings: AIMS: Facial and Oral Movements Muscles of Facial Expression: None, normal Lips and Perioral Area: None, normal Jaw: None, normal Tongue: None, normal,Extremity Movements Upper (arms, wrists, hands, fingers): None, normal Lower (legs, knees, ankles, toes): None, normal, Trunk Movements Neck, shoulders, hips: None, normal, Overall Severity Severity of abnormal movements (highest score from questions above): None, normal Incapacitation due to abnormal movements: None, normal Patient's awareness of abnormal movements (rate only patient's report): No Awareness, Dental Status Current problems with teeth and/or dentures?: No Does patient usually wear dentures?: No  CIWA:    COWS:     Musculoskeletal: Strength & Muscle Tone: within normal limits Gait & Station:  normal Patient leans: N/A  Psychiatric Specialty Exam: Physical Exam  Nursing note and vitals reviewed. Constitutional: He is oriented to person, place, and time. He appears well-developed and well-nourished.  Cardiovascular: Normal rate.  Respiratory: Effort normal.  Neurological: He is alert and oriented to person, place, and time.    Review of Systems  Constitutional: Negative.   Psychiatric/Behavioral: Positive for depression and substance abuse. Negative for hallucinations and suicidal ideas. The patient is not nervous/anxious and does not have insomnia.     Blood pressure 126/62, pulse 91, temperature 98.2 F (36.8 C), temperature source Oral, resp. rate 18, height 5\' 6"  (1.676 m), weight 76.7 kg, SpO2 98 %.Body mass index is 27.28 kg/m.  See MD's discharge SRA     Have you used any form of tobacco in the last 30 days? (Cigarettes, Smokeless Tobacco, Cigars, and/or Pipes): Yes  Has this patient used any form of tobacco in the last 30 days? (Cigarettes, Smokeless Tobacco, Cigars, and/or Pipes)  Yes, A prescription for an FDA-approved tobacco cessation medication was offered at discharge and the patient refused  Blood Alcohol level:  Lab Results  Component Value Date   [facility] 06/05/2019    Metabolic Disorder Labs:  Lab Results  Component Value Date   HGBA1C 5.2 06/07/2019   MPG 102.54 06/07/2019   No results found for: PROLACTIN Lab Results  Component Value Date   CHOL 155 06/07/2019   TRIG 66 06/07/2019   HDL 52 06/07/2019   CHOLHDL 3.0 06/07/2019   VLDL 13 06/07/2019   LDLCALC 90 06/07/2019    See Psychiatric Specialty Exam and Suicide Risk Assessment completed by Attending Physician prior to discharge.  Discharge destination:  Home  Is patient on multiple antipsychotic therapies at discharge:  No   Has Patient had three or more failed trials of antipsychotic monotherapy by history:  No  Recommended Plan for Multiple Antipsychotic  Therapies: NA   Allergies as of 06/11/2019      Reactions   Viread [tenofovir Disoproxil Fumarate] Hives      Medication List    TAKE these medications     Indication  Descovy 200-25 MG tablet Generic drug: emtricitabine-tenofovir AF Take 1 tablet by mouth daily.  Indication: HIV Disease   ibuprofen 200 MG tablet Commonly known as: ADVIL Take 200 mg by mouth every 6 (six) hours as needed for headache or moderate pain.  Indication: Fever, Headache   Melatonin 5 MG Tabs Take 1 tablet by mouth at bedtime as needed (sleep).  Indication: Depression   mirtazapine 15 MG tablet Commonly known as: REMERON Take 1 tablet (15 mg total) by mouth at bedtime.  Indication: Major Depressive Disorder   ondansetron 4 MG disintegrating tablet Commonly known as: Zofran ODT 4mg  ODT q4 hours prn nausea/vomit  Indication: Nausea and Vomiting   Prezista 800 MG tablet Generic drug: darunavir Take 1 tablet (800 mg total) by mouth daily. What changed: how much to take  Indication: HIV Disease   risperiDONE 2 MG tablet Commonly known as: RISPERDAL Take 1 tablet (2 mg total) by mouth at bedtime.  Indication: Hypomanic Episode of Bipolar Disorder   ritonavir 100 MG Tabs tablet Commonly known as: NORVIR Take 1 tablet (100 mg total) by mouth daily.  Indication: HIV Disease   traZODone 50 MG tablet Commonly known as: DESYREL Take 1 tablet (50 mg total) by mouth at bedtime.  Indication: Trouble Sleeping   venlafaxine XR 150 MG 24 hr capsule Commonly known as: EFFEXOR-XR Take 1 capsule (150 mg total) by mouth daily with breakfast. Start taking on: June 12, 2019  Indication: Major Depressive Disorder      Follow-up Information    [facility] Call on 06/11/2019.   Specialty: Addiction Medicine Why: A referral for residential treatment was faxed on your behalf on 06/06/2019. To follow up with this referral, please call [phone] and ask to speak with the  intake coordinator.  Contact information: [address] [place] [address] [phone]        [facility] Follow up on 06/13/2019.   Why: Telephonic hospital follow up appointment for HIV with Scotty Mechele Collinlliott is Thursday, 8/13 at 12:00p.  Please ask Scotty for mental health services; medication management and therapy.  Scotty will contact you.  Contact information: [facility] [address] [facility] [address] [facility] [address], [place], [place] [address] P: [phone] F: [phone]          Follow-up recommendations: Activity as tolerated. Diet as recommended by primary care physician. Keep all scheduled follow-up appointments as recommended.   Comments:   Patient is instructed to take all prescribed medications as recommended. Report any side effects or adverse reactions to your outpatient  psychiatrist. Patient is instructed to abstain from alcohol and illegal drugs while on prescription medications. In the event of worsening symptoms, patient is instructed to call the crisis hotline, 911, or go to the nearest emergency department for evaluation and treatment.  Signed: Aldean BakerJanet E Sharmon Cheramie, NP 06/11/2019, 12:57 PM

## 2019-06-11 NOTE — BHH Suicide Risk Assessment (Signed)
[facility] Discharge Suicide Risk Assessment   Principal Problem: MDD Discharge Diagnoses: Active Problems:   MDD (major depressive disorder), recurrent, severe, with psychosis ([place])   Total Time spent with patient: 45 minutes  Musculoskeletal: Strength & Muscle Tone: within normal limits Gait & Station: normal Patient leans: N/A  Psychiatric Specialty Exam: ROS  Blood pressure 126/62, pulse 91, temperature 98.2 F (36.8 C), temperature source Oral, resp. rate 18, height 5\' 6"  (1.676 m), weight 76.7 kg, SpO2 98 %.Body mass index is 27.28 kg/m.  General Appearance: Casual  Eye Contact::  Good  Speech:  Clear and Coherent409  Volume:  Normal  Mood:  Euthymic  Affect:  Full Range  Thought Process:  Coherent and Descriptions of Associations: Intact  Orientation:  Full (Time, Place, and Person)  Thought Content:  Logical  Suicidal Thoughts:  no  Homicidal Thoughts:  No  Memory:  Immediate;   Fair  Judgement:  Fair  Insight:  Fair  Psychomotor Activity:  Normal  Concentration:  Good  Recall:  Good  Fund of Knowledge:Good  Language: Good  Akathisia:  Negative  Handed:  Right  AIMS (if indicated):     Assets:  Communication Skills Desire for Improvement  Sleep:  Number of Hours: 6.25  Cognition: WNL  ADL's:  Intact   Mental Status Per Nursing Assessment::   On Admission:  Suicidal ideation indicated by patient  Demographic Factors:  Male, Gay, lesbian, or bisexual orientation and Low socioeconomic status  Loss Factors: Decrease in vocational status  Historical Factors: Impulsivity  Risk Reduction Factors:   Sense of responsibility to family and Religious beliefs about death  Continued Clinical Symptoms: stable w no si Cognitive Features That Contribute To Risk:  Polarized thinking    Suicide Risk:  Minimal: No identifiable suicidal ideation.  Patients presenting with no risk factors but with morbid ruminations; may be classified as minimal risk based on the  severity of the depressive symptoms  Follow-up [place]. Call on 06/11/2019.   Specialty: Addiction Medicine Why: A referral for residential treatment was faxed on your behalf on 06/06/2019. To follow up with this referral, please call [phone] and ask to speak with the intake coordinator.  Contact information: [place] [address] [phone]           Plan Of Care/Follow-up recommendations:  Activity:  full  Haya Hemler, MD 06/11/2019, 9:09 AM

## 2019-06-11 NOTE — Progress Notes (Signed)
  [facility] Adult Case Management Discharge Plan :  Will you be returning to the same living situation after discharge:  No.; Patient is going to [employer] At discharge, do you have transportation home?: Yes,  [employer] for 11am Do you have the ability to pay for your medications: Yes,  [employer]  Release of information consent forms completed and in the chart;  Patient's signature needed at discharge.  Patient to Follow up at: Follow-up [place]. Call on 06/11/2019.   Specialty: Addiction Medicine Why: A referral for residential treatment was faxed on your behalf on 06/06/2019. To follow up with this referral, please call [phone] and ask to speak with the intake coordinator.  Contact information: [place] [phone]        [facility] for HIV Preventure Follow up on 06/13/2019.   Why: Telephonic hospital follow up appointment for HIV with Scotty Vira Agar is Thursday, 8/13 at 12:00p.  Please ask Scotty for mental health services; medication management and therapy.  Scotty will contact you.  Contact information: [facility], [place], [place] [address] P: [phone] F: [phone]          Next level of care provider has access to [place] and Suicide Prevention discussed: Yes,  with pt  Have you used any form of tobacco in the last 30 days? (Cigarettes, Smokeless Tobacco, Cigars, and/or Pipes): Yes  Has patient been referred to the Quitline?: Patient refused referral  Patient has been referred for addiction treatment: Yes  Trecia Rogers, LCSW 06/11/2019, 10:08 AM

## 2019-06-11 NOTE — Progress Notes (Signed)
Pt d/c from the hospital with [facility]. All items returned. D/C instructions given, prescriptions given and samples given. Pt denies si and hi.
